# Patient Record
Sex: Female | Born: 1965 | Race: Black or African American | Hispanic: No | Marital: Single | Smoking: Current every day smoker
Health system: Southern US, Community
[De-identification: ages and names within clinical notes are randomized; demographics above are authoritative.]

## PROBLEM LIST (undated history)

## (undated) DIAGNOSIS — F419 Anxiety disorder, unspecified: Secondary | ICD-10-CM

## (undated) DIAGNOSIS — I10 Essential (primary) hypertension: Secondary | ICD-10-CM

## (undated) DIAGNOSIS — F32A Depression, unspecified: Secondary | ICD-10-CM

## (undated) HISTORY — PX: EYE SURGERY: SHX253

---

## 2004-05-17 ENCOUNTER — Emergency Department: Payer: Self-pay | Admitting: Emergency Medicine

## 2005-08-02 ENCOUNTER — Emergency Department: Payer: Self-pay | Admitting: Emergency Medicine

## 2007-01-16 ENCOUNTER — Emergency Department: Payer: Self-pay | Admitting: Emergency Medicine

## 2008-01-01 ENCOUNTER — Other Ambulatory Visit: Payer: Self-pay

## 2008-01-01 ENCOUNTER — Inpatient Hospital Stay: Payer: Self-pay | Admitting: Internal Medicine

## 2010-07-11 ENCOUNTER — Emergency Department: Payer: Self-pay | Admitting: Emergency Medicine

## 2013-01-01 ENCOUNTER — Inpatient Hospital Stay: Payer: Self-pay | Admitting: Family Medicine

## 2013-01-01 LAB — COMPREHENSIVE METABOLIC PANEL
Albumin: 3.3 g/dL — ABNORMAL LOW (ref 3.4–5.0)
Alkaline Phosphatase: 106 U/L (ref 50–136)
Anion Gap: 5 — ABNORMAL LOW (ref 7–16)
Chloride: 105 mmol/L (ref 98–107)
Creatinine: 0.61 mg/dL (ref 0.60–1.30)
EGFR (Non-African Amer.): >60
Osmolality: 276 (ref 275–301)
Potassium: 3 mmol/L — ABNORMAL LOW (ref 3.5–5.1)
SGOT(AST): 16 U/L (ref 15–37)
Sodium: 139 mmol/L (ref 136–145)
Total Protein: 7.3 g/dL (ref 6.4–8.2)

## 2013-01-01 LAB — LIPASE, BLOOD: Lipase: 3224 U/L — ABNORMAL HIGH (ref 73–393)

## 2013-01-01 LAB — CBC
HCT: 36.4 % (ref 35.0–47.0)
HGB: 12.3 g/dL (ref 12.0–16.0)
MCHC: 33.8 g/dL (ref 32.0–36.0)
MCV: 93 fL (ref 80–100)
RBC: 3.92 10*6/uL (ref 3.80–5.20)
RDW: 14.6 % — ABNORMAL HIGH (ref 11.5–14.5)

## 2013-01-01 LAB — URINALYSIS, COMPLETE
Blood: NEGATIVE
Glucose,UR: NEGATIVE mg/dL (ref 0–75)
RBC,UR: 1 /HPF (ref 0–5)
Specific Gravity: 1.016 (ref 1.003–1.030)
Squamous Epithelial: 5
WBC UR: 3 /HPF (ref 0–5)

## 2013-01-01 LAB — LIPID PANEL
Ldl Cholesterol, Calc: 89 mg/dL (ref 0–100)
Triglycerides: 110 mg/dL (ref 0–200)

## 2013-01-01 LAB — LACTATE DEHYDROGENASE: LDH: 191 U/L (ref 81–246)

## 2013-01-02 LAB — COMPREHENSIVE METABOLIC PANEL
Anion Gap: 9 (ref 7–16)
Bilirubin,Total: 0.3 mg/dL (ref 0.2–1.0)
Calcium, Total: 8.8 mg/dL (ref 8.5–10.1)
Co2: 27 mmol/L (ref 21–32)
Creatinine: 0.43 mg/dL — ABNORMAL LOW (ref 0.60–1.30)
EGFR (Non-African Amer.): >60
Potassium: 2.9 mmol/L — ABNORMAL LOW (ref 3.5–5.1)
SGPT (ALT): 13 U/L (ref 12–78)
Total Protein: 6.6 g/dL (ref 6.4–8.2)

## 2013-01-02 LAB — CANCER ANTIGEN 19-9: CA 19-9: 27 U/mL (ref 0–35)

## 2013-01-02 LAB — LIPASE, BLOOD: Lipase: 1730 U/L — ABNORMAL HIGH (ref 73–393)

## 2013-01-03 LAB — LIPASE, BLOOD: Lipase: 794 U/L — ABNORMAL HIGH (ref 73–393)

## 2013-01-03 LAB — CBC WITH DIFFERENTIAL/PLATELET
Basophil #: 0 10*3/uL (ref 0.0–0.1)
Basophil %: 0.6 %
Eosinophil %: 0.7 %
HGB: 10.6 g/dL — ABNORMAL LOW (ref 12.0–16.0)
Lymphocyte #: 1.2 10*3/uL (ref 1.0–3.6)
Neutrophil #: 4 10*3/uL (ref 1.4–6.5)
Neutrophil %: 70.8 %
RBC: 3.31 10*6/uL — ABNORMAL LOW (ref 3.80–5.20)
RDW: 14.2 % (ref 11.5–14.5)
WBC: 5.7 10*3/uL (ref 3.6–11.0)

## 2013-01-03 LAB — BASIC METABOLIC PANEL
Calcium, Total: 8.3 mg/dL — ABNORMAL LOW (ref 8.5–10.1)
Chloride: 104 mmol/L (ref 98–107)
Co2: 27 mmol/L (ref 21–32)
EGFR (Non-African Amer.): >60
Glucose: 75 mg/dL (ref 65–99)
Osmolality: 270 (ref 275–301)
Sodium: 137 mmol/L (ref 136–145)

## 2013-01-13 ENCOUNTER — Telehealth: Payer: Self-pay

## 2013-01-13 ENCOUNTER — Other Ambulatory Visit: Payer: Self-pay

## 2013-01-13 DIAGNOSIS — R932 Abnormal findings on diagnostic imaging of liver and biliary tract: Secondary | ICD-10-CM

## 2013-01-13 NOTE — Telephone Encounter (Signed)
Left message on machine to call back  

## 2013-01-13 NOTE — Telephone Encounter (Signed)
Pt must have CT on a disc at the appt.  Left message on machine to call back  Procedure Date: 01/22/13 Arrival Time: 1115 am Procedure Time: 1245 pm

## 2013-01-14 NOTE — Telephone Encounter (Signed)
Left message on machine to call back  

## 2013-01-15 NOTE — Telephone Encounter (Signed)
Pt states that she has already been scheduled for EUS on 01/26/13 at [facility], call placed to [facility] Left message on machine to call back

## 2013-01-15 NOTE — Telephone Encounter (Signed)
Pt was called and she informed me that she was already scheduled at [facility] on 01/26/13.  I called Dr Reyes Ivan office and Wyatt Mage states to cx the appt with Dr Christella Hartigan and the pt is to keep appt with [place] pt is aware and pt has been notified.  Procedure has been cx at [facility] with Noreene Larsson

## 2013-01-22 ENCOUNTER — Encounter (HOSPITAL_COMMUNITY): Admission: RE | Payer: Self-pay | Source: Ambulatory Visit

## 2013-01-22 ENCOUNTER — Ambulatory Visit (HOSPITAL_COMMUNITY): Admission: RE | Admit: 2013-01-22 | Payer: Self-pay | Source: Ambulatory Visit | Admitting: Gastroenterology

## 2013-01-22 SURGERY — UPPER ENDOSCOPIC ULTRASOUND (EUS) LINEAR
Anesthesia: Monitor Anesthesia Care

## 2013-03-04 ENCOUNTER — Inpatient Hospital Stay: Payer: Self-pay | Admitting: Internal Medicine

## 2013-03-04 LAB — COMPREHENSIVE METABOLIC PANEL
Albumin: 4.6 g/dL (ref 3.4–5.0)
Alkaline Phosphatase: 135 U/L (ref 50–136)
Anion Gap: 9 (ref 7–16)
BUN: 14 mg/dL (ref 7–18)
Bilirubin,Total: 0.9 mg/dL (ref 0.2–1.0)
Calcium, Total: 9.9 mg/dL (ref 8.5–10.1)
Chloride: 100 mmol/L (ref 98–107)
Co2: 25 mmol/L (ref 21–32)
Creatinine: 0.76 mg/dL (ref 0.60–1.30)
EGFR (African American): >60
EGFR (Non-African Amer.): >60
Glucose: 119 mg/dL — ABNORMAL HIGH (ref 65–99)
Osmolality: 270 (ref 275–301)
Potassium: 3 mmol/L — ABNORMAL LOW (ref 3.5–5.1)
SGOT(AST): 16 U/L (ref 15–37)
SGPT (ALT): 17 U/L (ref 12–78)
Sodium: 134 mmol/L — ABNORMAL LOW (ref 136–145)
Total Protein: 8.8 g/dL — ABNORMAL HIGH (ref 6.4–8.2)

## 2013-03-04 LAB — CBC
HCT: 43.4 % (ref 35.0–47.0)
Platelet: 433 10*3/uL (ref 150–440)
RBC: 4.77 10*6/uL (ref 3.80–5.20)
WBC: 11.7 10*3/uL — ABNORMAL HIGH (ref 3.6–11.0)

## 2013-03-04 LAB — URINALYSIS, COMPLETE
Bacteria: NONE SEEN
Leukocyte Esterase: NEGATIVE
Nitrite: NEGATIVE
Specific Gravity: 1.041 (ref 1.003–1.030)
Squamous Epithelial: 4

## 2013-03-05 LAB — COMPREHENSIVE METABOLIC PANEL
Albumin: 3.1 g/dL — ABNORMAL LOW (ref 3.4–5.0)
Anion Gap: 9 (ref 7–16)
BUN: 10 mg/dL (ref 7–18)
Calcium, Total: 8.6 mg/dL (ref 8.5–10.1)
Chloride: 106 mmol/L (ref 98–107)
Creatinine: 0.51 mg/dL — ABNORMAL LOW (ref 0.60–1.30)
EGFR (African American): >60
Glucose: 66 mg/dL (ref 65–99)
SGOT(AST): 15 U/L (ref 15–37)
SGPT (ALT): 13 U/L (ref 12–78)

## 2013-03-05 LAB — CBC WITH DIFFERENTIAL/PLATELET
Basophil %: 0.6 %
HCT: 34.1 % — ABNORMAL LOW (ref 35.0–47.0)
Lymphocyte #: 1.1 10*3/uL (ref 1.0–3.6)
MCV: 92 fL (ref 80–100)
Monocyte %: 7.5 %
RBC: 3.71 10*6/uL — ABNORMAL LOW (ref 3.80–5.20)
WBC: 7.5 10*3/uL (ref 3.6–11.0)

## 2013-03-05 LAB — MAGNESIUM: Magnesium: 1.6 mg/dL — ABNORMAL LOW

## 2013-03-06 LAB — COMPREHENSIVE METABOLIC PANEL
Anion Gap: 12 (ref 7–16)
Co2: 20 mmol/L — ABNORMAL LOW (ref 21–32)
EGFR (Non-African Amer.): >60
Osmolality: 265 (ref 275–301)
SGOT(AST): 22 U/L (ref 15–37)
SGPT (ALT): 13 U/L (ref 12–78)
Total Protein: 6.5 g/dL (ref 6.4–8.2)

## 2013-03-06 LAB — POTASSIUM: Potassium: 3.4 mmol/L — ABNORMAL LOW (ref 3.5–5.1)

## 2013-03-06 LAB — LIPASE, BLOOD: Lipase: 636 U/L — ABNORMAL HIGH (ref 73–393)

## 2013-04-29 ENCOUNTER — Emergency Department: Payer: Self-pay | Admitting: Emergency Medicine

## 2013-04-29 LAB — COMPREHENSIVE METABOLIC PANEL
Alkaline Phosphatase: 137 U/L — ABNORMAL HIGH (ref 50–136)
Anion Gap: 9 (ref 7–16)
Bilirubin,Total: 0.4 mg/dL (ref 0.2–1.0)
Calcium, Total: 9.4 mg/dL (ref 8.5–10.1)
Chloride: 105 mmol/L (ref 98–107)
Co2: 20 mmol/L — ABNORMAL LOW (ref 21–32)
Creatinine: 0.71 mg/dL (ref 0.60–1.30)
EGFR (African American): >60
SGOT(AST): 35 U/L (ref 15–37)
Total Protein: 8.1 g/dL (ref 6.4–8.2)

## 2013-04-29 LAB — URINALYSIS, COMPLETE
Bilirubin,UR: NEGATIVE
Blood: NEGATIVE
Glucose,UR: 50 mg/dL (ref 0–75)
Ketone: NEGATIVE
Nitrite: NEGATIVE
Ph: 6 (ref 4.5–8.0)
RBC,UR: 1 /HPF (ref 0–5)
Specific Gravity: 1.013 (ref 1.003–1.030)
Squamous Epithelial: 3
WBC UR: 1 /HPF (ref 0–5)

## 2013-04-29 LAB — CBC
MCV: 90 fL (ref 80–100)
Platelet: 428 10*3/uL (ref 150–440)
RBC: 4.84 10*6/uL (ref 3.80–5.20)

## 2013-04-29 LAB — LIPASE, BLOOD: Lipase: 2530 U/L — ABNORMAL HIGH (ref 73–393)

## 2013-07-09 HISTORY — PX: PANCREAS SURGERY: SHX731

## 2014-02-22 ENCOUNTER — Emergency Department: Payer: Self-pay | Admitting: Student

## 2014-02-22 LAB — URINALYSIS, COMPLETE
BILIRUBIN, UR: NEGATIVE
Bacteria: NONE SEEN
Blood: NEGATIVE
Glucose,UR: NEGATIVE mg/dL (ref 0–75)
Ketone: NEGATIVE
Leukocyte Esterase: NEGATIVE
NITRITE: NEGATIVE
PH: 5 (ref 4.5–8.0)
Protein: 30
RBC,UR: 3 /HPF (ref 0–5)
Specific Gravity: 1.029 (ref 1.003–1.030)

## 2014-02-22 LAB — COMPREHENSIVE METABOLIC PANEL
ANION GAP: 8 (ref 7–16)
Albumin: 2.7 g/dL — ABNORMAL LOW (ref 3.4–5.0)
Alkaline Phosphatase: 1561 U/L — ABNORMAL HIGH
BUN: 10 mg/dL (ref 7–18)
Bilirubin,Total: 0.4 mg/dL (ref 0.2–1.0)
CO2: 24 mmol/L (ref 21–32)
CREATININE: 0.78 mg/dL (ref 0.60–1.30)
Calcium, Total: 8.3 mg/dL — ABNORMAL LOW (ref 8.5–10.1)
Chloride: 110 mmol/L — ABNORMAL HIGH (ref 98–107)
EGFR (African American): >60
Glucose: 77 mg/dL (ref 65–99)
OSMOLALITY: 281 (ref 275–301)
POTASSIUM: 2.9 mmol/L — AB (ref 3.5–5.1)
SGOT(AST): 37 U/L (ref 15–37)
SGPT (ALT): 28 U/L
Sodium: 142 mmol/L (ref 136–145)
Total Protein: 7.3 g/dL (ref 6.4–8.2)

## 2014-02-22 LAB — CBC WITH DIFFERENTIAL/PLATELET
BASOS PCT: 1.2 %
Basophil #: 0.1 10*3/uL (ref 0.0–0.1)
Eosinophil #: 0.5 10*3/uL (ref 0.0–0.7)
Eosinophil %: 5.5 %
HCT: 39.3 % (ref 35.0–47.0)
HGB: 13 g/dL (ref 12.0–16.0)
LYMPHS ABS: 1.8 10*3/uL (ref 1.0–3.6)
Lymphocyte %: 21.3 %
MCH: 32.2 pg (ref 26.0–34.0)
MCHC: 33.1 g/dL (ref 32.0–36.0)
MCV: 97 fL (ref 80–100)
Monocyte #: 0.5 x10 3/mm (ref 0.2–0.9)
Monocyte %: 6.3 %
NEUTROS ABS: 5.4 10*3/uL (ref 1.4–6.5)
NEUTROS PCT: 65.7 %
PLATELETS: 490 10*3/uL — AB (ref 150–440)
RBC: 4.04 10*6/uL (ref 3.80–5.20)
RDW: 13.7 % (ref 11.5–14.5)
WBC: 8.2 10*3/uL (ref 3.6–11.0)

## 2014-02-22 LAB — LIPASE, BLOOD: Lipase: 575 U/L — ABNORMAL HIGH (ref 73–393)

## 2014-02-22 LAB — TROPONIN I: Troponin-I: <0.02 ng/mL

## 2014-07-28 ENCOUNTER — Inpatient Hospital Stay: Payer: Self-pay | Admitting: Surgery

## 2014-07-28 LAB — URINALYSIS, COMPLETE
BLOOD: NEGATIVE
Bacteria: NONE SEEN
Bilirubin,UR: NEGATIVE
Glucose,UR: NEGATIVE mg/dL (ref 0–75)
Ketone: NEGATIVE
LEUKOCYTE ESTERASE: NEGATIVE
NITRITE: NEGATIVE
PH: 6 (ref 4.5–8.0)
Protein: 30
RBC,UR: 1 /HPF (ref 0–5)
Specific Gravity: 1.015 (ref 1.003–1.030)
WBC UR: <1 /HPF (ref 0–5)

## 2014-07-28 LAB — CBC WITH DIFFERENTIAL/PLATELET
Basophil #: 0.1 10*3/uL (ref 0.0–0.1)
Basophil %: 0.6 %
EOS ABS: 0.1 10*3/uL (ref 0.0–0.7)
Eosinophil %: 0.7 %
HCT: 37.3 % (ref 35.0–47.0)
HGB: 12.4 g/dL (ref 12.0–16.0)
Lymphocyte #: 1.6 10*3/uL (ref 1.0–3.6)
Lymphocyte %: 19.4 %
MCH: 29.9 pg (ref 26.0–34.0)
MCHC: 33.3 g/dL (ref 32.0–36.0)
MCV: 90 fL (ref 80–100)
MONOS PCT: 4.7 %
Monocyte #: 0.4 x10 3/mm (ref 0.2–0.9)
NEUTROS ABS: 6.3 10*3/uL (ref 1.4–6.5)
NEUTROS PCT: 74.6 %
Platelet: 485 10*3/uL — ABNORMAL HIGH (ref 150–440)
RBC: 4.15 10*6/uL (ref 3.80–5.20)
RDW: 14.1 % (ref 11.5–14.5)
WBC: 8.4 10*3/uL (ref 3.6–11.0)

## 2014-07-28 LAB — COMPREHENSIVE METABOLIC PANEL
ALK PHOS: 1208 U/L — AB
ANION GAP: 10 (ref 7–16)
Albumin: 2.2 g/dL — ABNORMAL LOW (ref 3.4–5.0)
BILIRUBIN TOTAL: 0.8 mg/dL (ref 0.2–1.0)
BUN: 5 mg/dL — ABNORMAL LOW (ref 7–18)
CREATININE: 0.68 mg/dL (ref 0.60–1.30)
Calcium, Total: 7.5 mg/dL — ABNORMAL LOW (ref 8.5–10.1)
Chloride: 105 mmol/L (ref 98–107)
Co2: 25 mmol/L (ref 21–32)
EGFR (African American): >60
EGFR (Non-African Amer.): >60
GLUCOSE: 92 mg/dL (ref 65–99)
OSMOLALITY: 276 (ref 275–301)
Potassium: 2.9 mmol/L — ABNORMAL LOW (ref 3.5–5.1)
SGOT(AST): 39 U/L — ABNORMAL HIGH (ref 15–37)
SGPT (ALT): 39 U/L
SODIUM: 140 mmol/L (ref 136–145)
TOTAL PROTEIN: 6.2 g/dL — AB (ref 6.4–8.2)

## 2014-07-28 LAB — LIPID PANEL
CHOLESTEROL: 96 mg/dL (ref 0–200)
HDL Cholesterol: 33 mg/dL — ABNORMAL LOW (ref 40–60)
Ldl Cholesterol, Calc: 50 mg/dL (ref 0–100)
Triglycerides: 66 mg/dL (ref 0–200)
VLDL Cholesterol, Calc: 13 mg/dL (ref 5–40)

## 2014-07-28 LAB — TROPONIN I: Troponin-I: <0.02 ng/mL

## 2014-07-28 LAB — MAGNESIUM: Magnesium: 1.8 mg/dL

## 2014-07-28 LAB — LIPASE, BLOOD: Lipase: 448 U/L — ABNORMAL HIGH (ref 73–393)

## 2014-07-29 LAB — COMPREHENSIVE METABOLIC PANEL
ALBUMIN: 2 g/dL — AB (ref 3.4–5.0)
ALK PHOS: 891 U/L — AB
ANION GAP: 6 — AB (ref 7–16)
Bilirubin,Total: 0.7 mg/dL (ref 0.2–1.0)
CALCIUM: 7.8 mg/dL — AB (ref 8.5–10.1)
Chloride: 106 mmol/L (ref 98–107)
Co2: 22 mmol/L (ref 21–32)
Creatinine: 0.56 mg/dL — ABNORMAL LOW (ref 0.60–1.30)
EGFR (African American): >60
EGFR (Non-African Amer.): >60
GLUCOSE: 111 mg/dL — AB (ref 65–99)
POTASSIUM: 4.8 mmol/L (ref 3.5–5.1)
SGOT(AST): 23 U/L (ref 15–37)
SGPT (ALT): 27 U/L
SODIUM: 134 mmol/L — AB (ref 136–145)
TOTAL PROTEIN: 6.2 g/dL — AB (ref 6.4–8.2)

## 2014-07-29 LAB — LIPASE, BLOOD: Lipase: 266 U/L (ref 73–393)

## 2014-07-29 LAB — PHOSPHORUS: PHOSPHORUS: 1.9 mg/dL — AB (ref 2.5–4.9)

## 2014-07-30 LAB — BASIC METABOLIC PANEL
Anion Gap: 6 — ABNORMAL LOW (ref 7–16)
BUN: 2 mg/dL — ABNORMAL LOW (ref 7–18)
CREATININE: 0.5 mg/dL — AB (ref 0.60–1.30)
Calcium, Total: 8 mg/dL — ABNORMAL LOW (ref 8.5–10.1)
Chloride: 108 mmol/L — ABNORMAL HIGH (ref 98–107)
Co2: 25 mmol/L (ref 21–32)
EGFR (Non-African Amer.): >60
Glucose: 55 mg/dL — ABNORMAL LOW (ref 65–99)
Osmolality: 271 (ref 275–301)
Potassium: 4.7 mmol/L (ref 3.5–5.1)
SODIUM: 139 mmol/L (ref 136–145)

## 2014-07-31 LAB — COMPREHENSIVE METABOLIC PANEL
ANION GAP: 7 (ref 7–16)
Albumin: 1.6 g/dL — ABNORMAL LOW (ref 3.4–5.0)
Alkaline Phosphatase: 616 U/L — ABNORMAL HIGH
BILIRUBIN TOTAL: 0.5 mg/dL (ref 0.2–1.0)
BUN: 3 mg/dL — AB (ref 7–18)
CREATININE: 0.63 mg/dL (ref 0.60–1.30)
Calcium, Total: 7.6 mg/dL — ABNORMAL LOW (ref 8.5–10.1)
Chloride: 110 mmol/L — ABNORMAL HIGH (ref 98–107)
Co2: 22 mmol/L (ref 21–32)
GLUCOSE: 147 mg/dL — AB (ref 65–99)
OSMOLALITY: 277 (ref 275–301)
POTASSIUM: 4.5 mmol/L (ref 3.5–5.1)
SGOT(AST): 64 U/L — ABNORMAL HIGH (ref 15–37)
SGPT (ALT): 32 U/L
SODIUM: 139 mmol/L (ref 136–145)
Total Protein: 5.4 g/dL — ABNORMAL LOW (ref 6.4–8.2)

## 2014-07-31 LAB — CBC WITH DIFFERENTIAL/PLATELET
BASOS PCT: 0.8 %
Basophil #: 0.1 10*3/uL (ref 0.0–0.1)
EOS PCT: 0.5 %
Eosinophil #: 0.1 10*3/uL (ref 0.0–0.7)
HCT: 40.7 % (ref 35.0–47.0)
HGB: 13 g/dL (ref 12.0–16.0)
LYMPHS ABS: 1.1 10*3/uL (ref 1.0–3.6)
Lymphocyte %: 7 %
MCH: 29.2 pg (ref 26.0–34.0)
MCHC: 31.9 g/dL — AB (ref 32.0–36.0)
MCV: 92 fL (ref 80–100)
MONO ABS: 0.8 x10 3/mm (ref 0.2–0.9)
Monocyte %: 4.8 %
NEUTROS ABS: 14.2 10*3/uL — AB (ref 1.4–6.5)
Neutrophil %: 86.9 %
Platelet: 428 10*3/uL (ref 150–440)
RBC: 4.45 10*6/uL (ref 3.80–5.20)
RDW: 15.1 % — ABNORMAL HIGH (ref 11.5–14.5)
WBC: 16.3 10*3/uL — AB (ref 3.6–11.0)

## 2014-07-31 LAB — MAGNESIUM: MAGNESIUM: 1.6 mg/dL — AB

## 2014-07-31 LAB — PHOSPHORUS: Phosphorus: 2.6 mg/dL (ref 2.5–4.9)

## 2014-07-31 LAB — AMYLASE: AMYLASE: 52 U/L (ref 25–115)

## 2014-08-02 LAB — COMPREHENSIVE METABOLIC PANEL
Albumin: 1.5 g/dL — ABNORMAL LOW (ref 3.4–5.0)
Alkaline Phosphatase: 438 U/L — ABNORMAL HIGH
Anion Gap: 8 (ref 7–16)
BUN: 5 mg/dL — ABNORMAL LOW (ref 7–18)
Bilirubin,Total: 0.5 mg/dL (ref 0.2–1.0)
Calcium, Total: 7.1 mg/dL — ABNORMAL LOW (ref 8.5–10.1)
Chloride: 103 mmol/L (ref 98–107)
Co2: 23 mmol/L (ref 21–32)
Creatinine: 0.46 mg/dL — ABNORMAL LOW (ref 0.60–1.30)
EGFR (African American): >60
Glucose: 104 mg/dL — ABNORMAL HIGH (ref 65–99)
OSMOLALITY: 266 (ref 275–301)
Potassium: 3.4 mmol/L — ABNORMAL LOW (ref 3.5–5.1)
SGOT(AST): 19 U/L (ref 15–37)
SGPT (ALT): 23 U/L
SODIUM: 134 mmol/L — AB (ref 136–145)
Total Protein: 5.2 g/dL — ABNORMAL LOW (ref 6.4–8.2)

## 2014-08-02 LAB — CBC WITH DIFFERENTIAL/PLATELET
BASOS ABS: 0 10*3/uL (ref 0.0–0.1)
Basophil %: 0.4 %
EOS ABS: 0.2 10*3/uL (ref 0.0–0.7)
Eosinophil %: 3.4 %
HCT: 34.8 % — AB (ref 35.0–47.0)
HGB: 11.5 g/dL — AB (ref 12.0–16.0)
LYMPHS ABS: 1 10*3/uL (ref 1.0–3.6)
LYMPHS PCT: 13.9 %
MCH: 29.6 pg (ref 26.0–34.0)
MCHC: 32.9 g/dL (ref 32.0–36.0)
MCV: 90 fL (ref 80–100)
MONO ABS: 0.4 x10 3/mm (ref 0.2–0.9)
Monocyte %: 5.8 %
NEUTROS ABS: 5.3 10*3/uL (ref 1.4–6.5)
NEUTROS PCT: 76.5 %
Platelet: 399 10*3/uL (ref 150–440)
RBC: 3.87 10*6/uL (ref 3.80–5.20)
RDW: 14.4 % (ref 11.5–14.5)
WBC: 6.9 10*3/uL (ref 3.6–11.0)

## 2014-08-02 LAB — PHOSPHORUS: PHOSPHORUS: 2.3 mg/dL — AB (ref 2.5–4.9)

## 2014-08-02 LAB — MAGNESIUM: MAGNESIUM: 1.8 mg/dL

## 2014-08-03 LAB — COMPREHENSIVE METABOLIC PANEL
ALBUMIN: 1.5 g/dL — AB (ref 3.4–5.0)
ALK PHOS: 384 U/L — AB (ref 46–116)
ALT: 15 U/L (ref 14–63)
Anion Gap: 10 (ref 7–16)
BUN: 12 mg/dL (ref 7–18)
Bilirubin,Total: 0.4 mg/dL (ref 0.2–1.0)
CALCIUM: 7.4 mg/dL — AB (ref 8.5–10.1)
CO2: 23 mmol/L (ref 21–32)
Chloride: 104 mmol/L (ref 98–107)
Creatinine: 0.54 mg/dL — ABNORMAL LOW (ref 0.60–1.30)
EGFR (African American): >60
GLUCOSE: 107 mg/dL — AB (ref 65–99)
Osmolality: 274 (ref 275–301)
Potassium: 4.1 mmol/L (ref 3.5–5.1)
SGOT(AST): 16 U/L (ref 15–37)
Sodium: 137 mmol/L (ref 136–145)
Total Protein: 5.3 g/dL — ABNORMAL LOW (ref 6.4–8.2)

## 2014-08-03 LAB — CBC WITH DIFFERENTIAL/PLATELET
BASOS ABS: 0 10*3/uL (ref 0.0–0.1)
Basophil %: 0.3 %
Eosinophil #: 0.1 10*3/uL (ref 0.0–0.7)
Eosinophil %: 1.6 %
HCT: 37 % (ref 35.0–47.0)
HGB: 12 g/dL (ref 12.0–16.0)
LYMPHS PCT: 10.1 %
Lymphocyte #: 0.8 10*3/uL — ABNORMAL LOW (ref 1.0–3.6)
MCH: 28.8 pg (ref 26.0–34.0)
MCHC: 32.5 g/dL (ref 32.0–36.0)
MCV: 89 fL (ref 80–100)
MONO ABS: 0.8 x10 3/mm (ref 0.2–0.9)
Monocyte %: 10 %
NEUTROS PCT: 78 %
Neutrophil #: 6 10*3/uL (ref 1.4–6.5)
PLATELETS: 481 10*3/uL — AB (ref 150–440)
RBC: 4.18 10*6/uL (ref 3.80–5.20)
RDW: 14.7 % — AB (ref 11.5–14.5)
WBC: 7.7 10*3/uL (ref 3.6–11.0)

## 2014-08-04 LAB — TPN PANEL
ACTIVATED PTT: 38.9 s — AB (ref 23.6–35.9)
Albumin: 1.3 g/dL — ABNORMAL LOW (ref 3.4–5.0)
Alkaline Phosphatase: 306 U/L — ABNORMAL HIGH (ref 46–116)
Anion Gap: 7 (ref 7–16)
BUN: 13 mg/dL (ref 7–18)
CREATININE: 0.48 mg/dL — AB (ref 0.60–1.30)
Calcium, Total: 7.8 mg/dL — ABNORMAL LOW (ref 8.5–10.1)
Chloride: 105 mmol/L (ref 98–107)
Cholesterol: 70 mg/dL (ref 0–200)
Co2: 24 mmol/L (ref 21–32)
Glucose: 89 mg/dL (ref 65–99)
HGB: 10.8 g/dL — ABNORMAL LOW (ref 12.0–16.0)
INR: 1.2
Magnesium: 1.8 mg/dL
Osmolality: 272 (ref 275–301)
PLATELETS: 464 10*3/uL — AB (ref 150–440)
Phosphorus: 2.3 mg/dL — ABNORMAL LOW (ref 2.5–4.9)
Potassium: 3.9 mmol/L (ref 3.5–5.1)
Prothrombin Time: 14.9 secs — ABNORMAL HIGH (ref 11.5–14.7)
SGOT(AST): 15 U/L (ref 15–37)
Sodium: 136 mmol/L (ref 136–145)
TOTAL PROTEIN: 4.8 g/dL — AB (ref 6.4–8.2)
Triglycerides: 88 mg/dL (ref 0–200)
WBC: 7 10*3/uL (ref 3.6–11.0)

## 2014-08-04 LAB — AMYLASE, BODY FLUID: Amylase, Body Fluid: 9 U/L

## 2014-10-29 NOTE — H&P (Signed)
PATIENT NAME:  Megan Mayer, Megan Mayer MR#:  [id number] DATE OF BIRTH:  [birth year]  DATE OF ADMISSION:  03/04/2013  REFERRING PHYSICIAN: Sheryl L. Mindi Junker, MD  PRIMARY CARE PHYSICIAN: None.  CHIEF COMPLAINT: Abdominal pain.   HISTORY OF PRESENT ILLNESS: The patient is a 49 year old African American female with a history of pancreatitis a few times, hypertension, tobacco abuse, history of C. diff. She of note was hospitalized here in late June for pancreatitis, where she was found with abdominal mass in the pancreas. She was discharged after her pancreatitis improved. She was to go to [facility] for endoscopic ultrasound for further evaluation of the pancreatic mass. She stated that she went to [facility], but they just did abdominal ultrasound and stated that they will be forwarding it to her primary care physician, but she states she does not have a primary care physician. She has had no followup since previous discharge with any physician. She states that she has no insurance and when she calls the offices, she is unable to obtain appointments. She comes in after having progressive and continued abdominal pain. She stated that after her pain medications ran out early this month, her abdominal pain resumed. She states she has difficulty eating and she has abdominal bloating. She came in here today as the pain persisted and she was found with a lipase of 3600. Hospitalist services were contacted for further evaluation and management.   PAST MEDICAL HISTORY: History of hypertension, noncompliant with medications, acute pancreatitis, history of C. diff.  FAMILY HISTORY: Brain cancer in the mother.   SOCIAL HISTORY: Denies tobacco, alcohol or drug use. Is unemployed.   ALLERGIES: No known drug allergies.   HOME MEDICATIONS: She states she has not taken any medications for her blood pressure since her medications ran out. Of note, she was discharged on amlodipine 5 mg daily, clonidine 0.1 mg 2 times a day, aspirin 81 mg  daily, Aleve p.r.n., hydrochlorothiazide/lisinopril 25/20 mg 1 tab daily and some acetaminophen/hydrocodone prior, but she does not take any medications.   REVIEW OF SYSTEMS:    CONSTITUTIONAL: Positive for weight loss of about 30 pounds since June, not much since that last discharge she states. No fevers, fatigue or weakness.  EYES: No blurry vision or double vision.  ENT: No tinnitus or hearing loss. No snoring.  RESPIRATORY: Denies cough, wheezing, shortness of breath, painful respirations.  CARDIOVASCULAR: Denies chest pains, orthopnea or swelling in the legs. No arrhythmias.  GASTROINTESTINAL: No nausea, vomiting or diarrhea. Has abdominal pain in upper abdomen shooting to the back. No melena or dark stools. History of pancreatitis.  GENITOURINARY: Denies dysuria or hematuria.  HEMATOLOGIC AND LYMPHATIC: Denies anemia or easy bruising.  SKIN: No rashes.  MUSCULOSKELETAL: Denies arthritis or gout.  NEUROLOGIC: Denies focal weakness, numbness, stroke or seizures.  PSYCHIATRIC: Denies anxiety, insomnia.   PHYSICAL EXAMINATION:  VITAL SIGNS: Temperature on arrival 98.1, initial pulse 90, respiratory rate 18. Blood pressure was 193/120. Last blood pressure is 185/109. O2 sat was 99% on room air.  GENERAL: The patient is a cachectic-appearing, thin African American female lying in bed, no obvious distress.  HEENT: Normocephalic, atraumatic. Pupils are equal and reactive. Anicteric sclerae. Extraocular muscles intact. Dry mucous membranes.  NECK: Supple. No thyroid tenderness. No cervical lymphadenopathy. No JVD.  CARDIOVASCULAR: S1, S2, regular rate and rhythm. No murmurs, rubs or gallops.  LUNGS: Clear to auscultation. No wheezing, rhonchi or rales.  ABDOMEN: Soft, thin. Positive bowel sounds all quadrants. She has tenderness, epigastric area upper, to  palpation. No rebound or guarding.  EXTREMITIES: No significant lower extremity edema.  SKIN: No obvious rashes.  NEUROLOGIC: Cranial nerves  II through XII grossly intact. Strength is 5 out of 5 in all extremities. Sensation is intact to light touch.  PSYCHIATRIC: Awake, alert, oriented x 3, cooperative.   LABORATORY AND RADIOLOGICAL DATA: Glucose 119, BUN 14, creatinine 0.76, sodium 134, potassium 3. Lipase is 3652. Total protein is 8.8, otherwise within normal limits. WBC is 11.7, hemoglobin 15; platelets are 433. Urinalysis has 3+ blood, 2616 RBC, 13 WBC, no bacteria. No imaging done, but CAT scan has been ordered, and I do not see an EKG in the chart.   ASSESSMENT AND PLAN: We have a 49 year old African American female with history of hypertension, who is not on any medications as her pills ran out, history of pancreatitis in the past, was supposed to have endoscopic ultrasound for further evaluation, as there is a pancreatic mass with history of biliary pancreatitis in the past, who comes in for ongoing pain for a few weeks after her pain pills "ran out." Of note, she initially had a CAT scan done previously showing a 6.6 cm mass with dilated CBD. She has had no luck seeing a physician, as she states she has no insurance. At this point, we will bring her in for the acute pancreatitis. A CAT scan has been ordered to further investigate this mass. Will obtain a gastroenterology consult, start her on IV fluid resuscitation, morphine and Tylenol p.r.n., and put in a case management consult to help to plug her into [facility] perhaps. We would  trend the lipase. We will see what the CAT scan shows. As the patient had no further work-up such as endoscopic ultrasound, we will see if we can perhaps arrange that as well by discharge, at [facility] if it is possible or [facility]. This could be a pseudocyst or a malignancy, but needs further work-up. The patient also has accelerated hypertension secondary to not taking any medications and also possibly pain attributing to some of this. I would resume some of her outpatient medications including amlodipine and  clonidine and start her on hydralazine IV p.r.n. We will try to control the pain. She has hypokalemia and we would replace the potassium. We will start her on deep vein prophylaxis with heparin and obtain a GI consult.   CODE STATUS: The patient is full code.   TOTAL TIME SPENT: 50 minutes.    ____________________________ Krystal EatonShayiq Jahnae Mcadoo, MD sa:jm D: 03/04/2013 12:55:44 ET T: 03/04/2013 13:26:40 ET JOB#: [id number]  cc: Krystal EatonShayiq Orlie Cundari, MD, <Dictator> Krystal EatonSHAYIQ Dai Mcadams MD ELECTRONICALLY SIGNED 03/31/2013 13:46

## 2014-10-29 NOTE — Consult Note (Signed)
PATIENT NAME:  Megan Mayer, Megan Mayer MR#:  [id number] DATE OF BIRTH:  10-10-1965  DATE OF CONSULTATION:  01/02/2013  REFERRING PHYSICIAN:  Dr. Darvin Neighbours. CONSULTING PHYSICIAN:  Lollie Sails, MD  REASON FOR CONSULTATION:  Pancreatitis, possible pancreatic mass.   HISTORY OF PRESENT ILLNESS: Megan Mayer is a very pleasant 49 year old African American female who presented to the Emergency Room last night with problems of abdominal pain. She states that about 2 weeks ago she began to have some increasing problems with abdominal pain. This seemed to occur after eating. She then fell this past Tuesday against a table and injured her right upper quadrant. She states she felt much worse after that. She has had no nausea or vomiting. The apparent injury from the fall seems to be getting better. She states that prior to this episode, she has been having no problems with nausea, vomiting or abdominal pain. No heartburn or dysphagia. She has a bowel movement once a day. There are no black stools, blood in stools or slimy stools. There is no history of peptic ulcer disease. She has never had a colonoscopy. The patient states that she did have an episode of pancreatitis about 3 to 4 years ago. In review, this was apparently related to biliary pancreatitis with finding of an 8.8 mm common bile duct, as well as sludge in the gallbladder. This was on 07/11/2010. She did not undergo cholecystectomy at that time.   PAST MEDICAL HISTORY: The patient has a history of hypertension. History of ongoing tobacco use. History of C. diff colitis. Acute pancreatitis, biliary in nature, as noted above.   SOCIAL HISTORY: The patient is a smoker, about a third of a pack of cigarettes daily. She denies any alcohol use for at least 4 years. Prior to that, she was never a regular alcohol user or of large volume. There is no history of illicit drugs.   ALLERGIES: There are no known drug allergies.   MEDICATIONS:  The patient takes no  home medications.   REVIEW OF SYSTEMS:  Ten systems reviewed per admission history and physical, agree with same.   PHYSICAL EXAMINATION: VITAL SIGNS: Temperature is 98, pulse 75, respirations 16, blood pressure 148/100, pulse ox 99%.  GENERAL: She is a 49 year old African American female in no acute distress.  HEENT: Normocephalic, atraumatic. Eyes are anicteric.  Nose: Septum midline. No lesions. Oropharynx: No lesions.  NECK: Supple. No JVD.  HEART: Regular rate and rhythm without rub or gallop.  LUNGS: Bilaterally clear.  ABDOMEN: Soft. She is markedly tender to palpation in the epigastric region extending into the left and right upper quadrants. The right upper quadrant may be somewhat more so than the left. There are no masses palpated, although it is difficult to palpate due to pain. Bowel sounds are positive.  RECTAL: Anorectal exam is deferred.  EXTREMITIES: No clubbing, cyanosis or edema.  NEUROLOGICAL: Cranial nerves II through XII grossly intact. Muscle strength bilaterally equal and symmetric.   RADIOLOGIC AND LABORATORY DATA:  On admission to the hospital on the 26th, she had a glucose of 100, BUN 8, creatinine 0.61, sodium 139, potassium 3.0, chloride 105, calcium 9.1. Magnesium 1.5. Cholesterol 157, triglycerides 110. Her lipase was 3224. Her hepatic profile showing a total protein of 7.3, albumin 3.3, total bilirubin 0.2, alk phos 106, AST 16, ALT 15. Hemogram is showing a white count of 8.8, H and H 12.3/36.4, platelet count of 491, MCV is 93. Urinalysis was negative. She had a CA-19-9 done that was  negative at 27. Repeat laboratories this morning showed glucose at 101, BUN 5, creatinine 0.43, sodium 135, potassium 2.9. Her lipase had decreased to 1730. Her hepatic profile showing a total protein of 6.6, albumin 3.0, total bilirubin 0.3, alk phos 97, AST 13, ALT 13. She has had 2 imaging studies. She had an abdominal ultrasound, which showed an abnormal mass arising from the head of  the pancreas or the adjacent left hepatic lobe. This measured 6.6 cm in diameter. There was sludge in the gallbladder, but no discrete stones, and the common bile duct was dilated at 9.7 mm. The origin of the mass lesion was, indeed, very difficult to ascertain for this study. There was an abdominal CT scan was also, which showed findings concerning for a pancreatic head and uncinate process mass. Findings was also consistent with chronic pancreatitis and acute on chronic FLAIR. There was diffuse dilatation of pancreatic duct and this measuring about 9.3 mm. Of note, there was also evidence of atrophy of the body and tail of the pancreas.   ASSESSMENT:   1.  Pancreatitis. CT scanning, as well as abdominal ultrasound were concerning for possible pancreatic mass. In the setting of a low CA-19-9, this is quite likely an inflammatory mass rather than pancreatic cancer. The patient does have a history of biliary pancreatitis in the past, and indeed, may be presenting with an extension of that, along with evidence of chronic pancreatitis. There is atrophy of the body and tail of the pancreas. Currently, the patient is feeling somewhat better. Her lipase has improved.   RECOMMENDATIONS: 1.  Agree with further evaluation via endoscopic ultrasound. We do not have that capability currently at [facility]; however, the patient could be sent to either [facility], or indeed, [facility] in [place] for that procedure. In the interim, would continue on current treatment of bowel rest, IV hydration, along with pain management. Would consider repeating the abdominal ultrasound in about 3 days for comparison purposes, as well as surgical opinion in regards to possibility of cholecystectomy.    ____________________________ Lollie Sails, MD mus:dmm D: 01/02/2013 17:28:55 ET T: 01/02/2013 19:39:10 ET JOB#: [id number]  cc: Lollie Sails, MD, <Dictator> Lollie Sails MD ELECTRONICALLY SIGNED  02/02/2013 7:13

## 2014-10-29 NOTE — Discharge Summary (Signed)
PATIENT NAME:  Megan Mayer, Megan Mayer MR#:  [id number] DATE OF BIRTH:  [birth year]  DATE OF ADMISSION:  03/04/2013 DATE OF DISCHARGE:  03/06/2013  DISCHARGE DIAGNOSES: 1.  Acute on chronic pancreatitis.  2.  Acute uncontrolled hypertension. 3.  Severe chronic malnutrition.  4.  Noncompliance to medication.   CONDITION ON DISCHARGE: Stable.   CODE STATUS: Full code.   DISCHARGE MEDICATIONS: 1.  Oxycodone 15 mg oral tablet extended-release every 12 hours for 10 days.  2.  Percocet 325/5 mg oral tablet every 8 hours as needed for pain.  3.  Lisinopril 20 mg once a day.  4.  Clonidine 0.1 mg oral tablet 2 times a day.  5.  Creon 12,000 units 1 capsule 3 times a day.  6.  Amlodipine 5 mg oral tablet once a day.  7.  Hydrochlorothiazide 25 mg oral tablet once a day.   DIET ON DISCHARGE: Low-sodium, low-fat, low-cholesterol, Ensure Plus dietary supplement 2 times per day.   ACTIVITY LIMITATION: As tolerated.   FOLLOW-UP: Within 1 to 2 weeks at [facility].   HISTORY OF PRESENT ILLNESS: The patient is a 49 year old female with history of pancreatitis a few times in the past. She was having abdominal pain and so came to the ER. As per history, the patient was admitted to our hospital a month ago and found having some pancreatic mass. She was sent to [facility] for endoscopic ultrasound for further evaluation of pancreatic mass. At [facility], they did abdominal ultrasound versus an endoscopic and discharged her home with advice to follow later on. She did not have any insurance and so was unable to follow with any doctor, was unable to get any medication also and started having abdominal pain, so decided to come to the Emergency Room. Her lipase was found 3600 in the ER, so admitted with acute on chronic pancreatitis. GI consult was called in with Dr. Bluford Kaufmann and he suggested managing conservatively her pancreatitis issue at this time and insisted about her following at [facility] for endoscopic ultrasound or as [facility] is to going to be equiped for this and enough support for this procedure very soon, to get endoscopic ultrasound and so that was the plan suggested by Dr. Bluford Kaufmann.     OTHER MEDICAL ISSUES:  1.  Pancreatic mass versus cyst. Endoscopic ultrasound when possible at [facility] or [facility].  2.  Acute on chronic pancreatitis. Maintained on IV fluids and pain management. She tolerated gradually liquid diet and then regular diet later on after 3 days and was discharged home with advice to follow.  3.  Uncontrolled hypertension due to noncompliance, started on amlodipine, clonidine and lisinopril and blood pressure came down under control.  4.  Electrolyte imbalance, hypokalemia and hypomagnesemia, was replaced IV and the patient remained stable.   CONSULT IN THE HOSPITAL: Dr. Bluford Kaufmann for GI.   LABORATORY RESULTS:  Creatinine 0.76, sodium was 134, potassium was 3 on presentation. WBC was 11.7, hemoglobin was 15 and platelet count was 433. Lipase was 3652. Urinalysis was having 2600 RBCs and 13 WBC with negative leukocyte esterase.   CT of the abdomen and pelvis with contrast: Marked edema of pancreatic head, likely related to acute pancreatitis, superimposed changes, chronic pancreatitis.   Lipase level came down on 28th of August, 1157 and on 29th, further came down to 636.   TOTAL TIME SPENT ON THIS DISCHARGE: 40 minutes.   ____________________________ Megan Mayer [name], MD vgv:cc D: 03/10/2013 21:55:41 ET T: 03/10/2013 23:15:33 ET JOB#:  [id number]  cc: Megan PigeonVaibhavkumar G. Elisabeth PigeonVachhani, MD, <Dictator> Ezzard StandingPaul Y. Bluford Kaufmannh, MD  Altamese DillingVAIBHAVKUMAR Hansford Hirt MD ELECTRONICALLY SIGNED 03/13/2013 19:11

## 2014-10-29 NOTE — Consult Note (Signed)
Pt seen and examined. Full consult to follow. Less pain this AM. Lipase coming down. Inflammatory mass seen on CT. Needs EUS eventually. [facility] only did abd u/s only. We are supposed to have [facility] coming to [facility] starting sometime in October. Perhaps, this patient can have it then. Continue IV hydration. NPO for now. Possibly start clear liquids tomorrow if continues to improve. Recommend pancreatic enzyme supplements with each meal/snack since patient likely has chronic pancreatitis.  Electronic Signatures: Lutricia Feilh, Glennie Bose (MD)  (Signed on 28-Aug-14 12:36)  Authored  Last Updated: 28-Aug-14 12:36 by Lutricia Feilh, Antonya Leeder (MD)

## 2014-10-29 NOTE — H&P (Signed)
PATIENT NAME:  Megan Mayer, Megan Mayer MR#:  [id number] DATE OF BIRTH:  August 05, 1965  DATE OF ADMISSION:  01/01/2013  PRIMARY CARE PHYSICIAN: None.   CHIEF COMPLAINT: Abdominal pain of 2 weeks.   HISTORY OF PRESENTING ILLNESS: The patient is Mayer 49 year old African American female patient with past history of hypertension which is untreated, acute pancreatitis, tobacco abuse presents to the Emergency Room complaining of 2 weeks of epigastric abdominal pain. The patient also has some right lower chest pain since Mayer mechanical fall Mayer week back. The patient has had no nausea, vomiting, diarrhea, hematemesis, melena. She mentions she lost about 30 pounds in the last 2 weeks, poor appetite, early satiety. The patient had an ultrasound of the abdomen done today in the Emergency Room which shows Mayer pancreatic head mass about 6 x 6 cm with dilated CBD of 9.7 cm.   The patient had similar pain 1 year back, was here in the Emergency Room with lipase of 1800, was discharged home with diagnosis of acute pancreatitis, unknown etiology. The patient is not on any medications, not alcoholic. No history of hyperlipidemia.   PAST MEDICAL HISTORY: 1.  Untreated hypertension.  2.  Tobacco abuse.  3.  Acute pancreatitis.  4.  C. diff colitis.   FAMILY HISTORY: Brain cancer in mother.  Father was killed.   SOCIAL HISTORY: The patient smokes Mayer pack Mayer day. Does not drink any alcohol. No illicit drugs. Is unemployed.   ALLERGIES: No known drug allergies.   HOME MEDICATIONS: None.   REVIEW OF SYSTEMS:  CONSTITUTIONAL: Complains of some fatigue. Had significant weight loss.  EYES: No blurred vision, pain, redness.  ENT: No tinnitus, ear pain, hearing loss.  RESPIRATORY: No cough, wheeze, hemoptysis.  CARDIOVASCULAR: No chest pain, orthopnea, edema.  GASTROINTESTINAL: Had some nausea. No vomiting. Has abdominal pain. No melena, hematemesis.  GENITOURINARY: No dysuria, hematuria, frequency.  ENDOCRINE: No polyuria,  nocturia, thyroid problems.  HEMATOLOGIC/LYMPHATIC: No anemia, easy bruising, bleeding.  INTEGUMENTARY: No acne, rash, lesions.  MUSCULOSKELETAL: No joint swelling, back pain or arthritis.  NEUROLOGICAL: No focal numbness, weakness, dysarthria.  PSYCHIATRIC: No anxiety or depression.   PHYSICAL EXAMINATION: VITAL SIGNS: Temperature 97.8, pulse of 107, blood pressure 205/113, saturating 99% on room air.  GENERAL: Mayer thin African American female patient lying in bed, conversational, cooperative with exam.  PSYCHIATRIC:  Is alert and oriented x 3. Mood and affect are appropriate. Judgment intact.  HEENT: Atraumatic, normocephalic. Oral mucosa moist and pink. External ears and nose normal. No pallor.  His pupils are bilaterally equal and reactive to light.  NECK: Supple. No thyromegaly. No palpable lymph nodes. Trachea midline. No carotid bruit or JVD.  CARDIOVASCULAR: S1, S2, tachycardic without any murmurs. Peripheral pulses 2+. No edema.  RESPIRATORY: Normal work of breathing. Clear to auscultation on both sides, has tenderness in the right lower chest.  GASTROINTESTINAL: Soft abdomen. Tenderness in the epigastric and right upper quadrant area. Murphy's sign negative. Bowel sounds present. No hepatosplenomegaly palpable.  SKIN: Warm and dry. No petechiae, rash, ulcers.  MUSCULOSKELETAL: No joint swelling, redness, effusion of the large joints. Normal muscle tone.  NEUROLOGICAL: Motor strength 5 out of 5 in upper and lower extremities. Sensation is intact all over.  LYMPHATIC: No cervical or supraclavicular lymphadenopathy.   GENITOURINARY: No CVA tenderness or bladder distention.   LABORATORY AND RADIOLOGICAL DATA:  Lab studies show glucose of 100, BUN 8, creatinine 0.61, sodium 139, potassium 3, chloride 105, GFR greater than 60, magnesium 1.5, lipase of 3224,  LDH 191, AST, ALT, alkaline phosphatase bilirubin normal. WBC 8.8, hemoglobin 12.3, platelets of 491.  Ultrasound of the abdomen shows  an abnormal mass that may be arising from head of the pancreas or from the adjacent left hepatic lobe, measures 6.6 cm in diameter. Sludge within the gallbladder but no discrete stones. Murphy's negative.   Common bile duct dilated at 9.7 mm.   ASSESSMENT AND PLAN: 1.  Acute pancreatitis with Mayer pancreatic head mass on ultrasound. The patient also had significant weight loss of 30 pounds in the last 2 weeks. We will put the patient on liquids if she can tolerate. Pain management with IV pain medications. I will get Mayer CT scan of the abdomen with contrast. Her mass could be either malignancy or pseudocyst, doubt this is an abscess with no fever, no white count. We will consult GI regarding further input with the case. We will also check Mayer fasting lipid profile.  2.  Hypertensive urgency. The patient is not on treatment for her hypertension.  We will start her on p.o. meds, use IV p.r.n. medications as needed.  3.  Tobacco abuse. I have counseled the patient to quit smoking for greater than 3 minutes. I did tell her this could be Mayer pancreatic malignancy, awaiting further tests, and she should quit smoking at this time. The patient has verbalized understanding.  4.  Deep venous thrombosis prophylaxis with Lovenox.   TIME SPENT:   Today on this case was 45 minutes.    ____________________________ Megan Bailiff Miles Leyda, MD srs:cb D: 01/01/2013 16:48:51 ET T: 01/01/2013 17:07:26 ET JOB#: [id number]  cc: Wardell Heath R. Elpidio Anis, MD, <Dictator> Orie Fisherman MD ELECTRONICALLY SIGNED 01/02/2013 15:54

## 2014-10-29 NOTE — Consult Note (Signed)
Chief Complaint:  Subjective/Chief Complaint Covering for Dr. Gustavo Lah. Feels much better. Some abd pain. Currently on full liquid diet plus nutritional supplement.   VITAL SIGNS/ANCILLARY NOTES: **Vital Signs.:   28-Jun-14 05:32  Vital Signs Type Routine  Temperature Temperature (F) 98.4  Celsius 36.8  Temperature Source oral  Pulse Pulse 69  Respirations Respirations 20  Systolic BP Systolic BP 277  Diastolic BP (mmHg) Diastolic BP (mmHg) 78  Mean BP 100  Pulse Ox % Pulse Ox % 97  Pulse Ox Activity Level  At rest  Oxygen Delivery Room Air/ 21 %   Brief Assessment:  GEN no acute distress   Cardiac Regular   Respiratory clear BS   Gastrointestinal mild epig tenderness   Lab Results: Routine Chem:  28-Jun-14 06:21   BUN  5  Creatinine (comp) 0.64  Sodium, Serum 137  Potassium, Serum 3.5  Chloride, Serum 104  CO2, Serum 27  Calcium (Total), Serum  8.3  Anion Gap  6  Osmolality (calc) 270  eGFR (African American) >60  eGFR (Non-African American) >60 (eGFR values <72m/min/1.73 m2 may be an indication of chronic kidney disease (CKD). Calculated eGFR is useful in patients with stable renal function. The eGFR calculation will not be reliable in acutely ill patients when serum creatinine is changing rapidly. It is not useful in  patients on dialysis. The eGFR calculation may not be applicable to patients at the low and high extremes of body sizes, pregnant women, and vegetarians.)  Lipase  794 (Result(s) reported on 03 Jan 2013 at 06:50AM.)  Magnesium, Serum  1.5 (1.8-2.4 THERAPEUTIC RANGE: 4-7 mg/dL TOXIC: > 10 mg/dL  -----------------------)  Routine Hem:  28-Jun-14 06:21   WBC (CBC) 5.7  RBC (CBC)  3.31  Hemoglobin (CBC)  10.6  Hematocrit (CBC)  30.7  Platelet Count (CBC)  449  MCV 93  MCH 32.0  MCHC 34.4  RDW 14.2  Neutrophil % 70.8  Lymphocyte % 20.9  Monocyte % 7.0  Eosinophil % 0.7  Basophil % 0.6  Neutrophil # 4.0  Lymphocyte # 1.2  Monocyte  # 0.4  Eosinophil # 0.0  Basophil # 0.0 (Result(s) reported on 03 Jan 2013 at 06:49AM.)   Radiology Results: CT:    26-Jun-14 18:40, CT Abdomen and Pelvis With Contrast  CT Abdomen and Pelvis With Contrast   REASON FOR EXAM:    (1) abd pain, pancreatic mass; (2) abd pain  COMMENTS:       PROCEDURE: CT  - CT ABDOMEN / PELVIS  W  - Jan 01 2013  6:40PM     RESULT: CT and pelvis dated 01/01/2013    Technique: Helical 3 mm sections were obtained from the lung bases   through the pubic symphysis status post intravenous ministration of 85 mL   of Isovue-370 and oral contrast.    Findings: The lung bases are unremarkable. Minimal hypoventilation is   appreciated.    The liver, spleen, adrenals, kidneys are unremarkable.  Evaluation of the pancreas demonstrates a component of mild to moderate   atrophy involving the body and tail and there is diffuse dilation of the   pancreatic duct. Evaluation of the head and uncinate process demonstrates   course calcifications within the pancreatic parenchyma. There is   heterogeneous enhancement of the uncinate process and head with a   masslike configuration. These findings are concerning for a pancreatic   head and uncinate process mass until proven otherwise. Alternatively   changes secondary to chronic pancreatitis with an  acute FLAIR cannot be   excluded particularly if clinically appropriate. The heterogeneous   enhancement in the appropriate clinical setting raise concern of areas of   early or mild necrosis. There is a minimal amount of peripancreatic fluid   and a minimal amount of peripancreatic inflammatory change. The most   confluent masslike area measures 4.8 x 5.7 cm in AP by transverse   dimensions.  Air-filled distended loops of large bowel appreciated within the abdomen   as well as of fecal retention in the ascending colon. Contrast filled   loops small bowel identified in the abdomen pelvis urinary bladder is   distended with  urine.    There is no evidence of abdominal aortic aneurysm.    IMPRESSION:  Findings concerning for a pancreatic head and uncinate   process mass otherwise. There are findings consistent with chronic   pancreatitis and acute on chronic FLAIR is also of diagnostic   consideration. Tissue sampling with endoscopic ultrasound is recommended.  2. Diffuse dilation of the pancreatic duct and 9.3 mm. This finding to be   secondary to chronic pancreatitis though a component of dilatation may be   secondary to obstruction to the is matched.    Verified By: Mikki Santee, M.D., MD   Assessment/Plan:  Assessment/Plan:  Assessment Pancreatitis with pancreatic mass   Plan Moniter lipase as diet is advanced. If patient can tolerate low fat diet, then patient can be discharged to home with EUS as outpt. thanks.   Electronic Signatures: Verdie Shire (MD)  (Signed 28-Jun-14 09:29)  Authored: Chief Complaint, VITAL SIGNS/ANCILLARY NOTES, Brief Assessment, Lab Results, Radiology Results, Assessment/Plan   Last Updated: 28-Jun-14 09:29 by Verdie Shire (MD)

## 2014-10-29 NOTE — Consult Note (Signed)
PATIENT NAME:  Megan Mayer, Megan Mayer MR#:  [id number] DATE OF BIRTH:  May 26, 1966  DATE OF CONSULTATION:  03/05/2013  REFERRING PHYSICIAN:   CONSULTING PHYSICIAN:  Ezzard StandingPaul Y. Bluford Kaufmannh, MD  REASON FOR REFERRAL:  Acute pancreatitis.   HISTORY OF PRESENT ILLNESS:  The patient is Mayer 49 year old black female with Mayer known history of pancreatitis. She thought that she was initially diagnosed by 3 or 4 years ago. She use to abuse tobacco and alcohol but has not drank much the last year or two. She also apparently has Mayer history of C. diff as well. She was hospitalized in late June for Mayer bout of pancreatitis. Mayer CT scan at that time showed an abdominal mass at the head. She was then discharged after Mayer hospitalization and [facility] evaluation. Unfortunately, they only did an abdominal ultrasound instead. She has not been able to follow up because she does not have any insurance.   She was on some pain medication but ran out approximately Mayer month ago. Unfortunately, after she ran out of pain medication, the pain started to increase over time to the point that she had to come into the hospital for evaluation. She also complained of some abdominal bloating, but no nausea or vomiting. When she came in, her lipase was 3600. As Mayer result, the patient was admitted for further evaluation and treatment.   The patient was made n.p.o., given fluids. This morning she does feel better with less abdominal pain. She also admits to losing at least 30 pounds in the last several months.   PAST MEDICAL HISTORY:  Notable for hypertension and history of pancreatitis and C. diff.   FAMILY HISTORY:  Notable for brain cancer in mother.   SOCIAL HISTORY:  There is no tobacco or alcohol use. She is currently unemployed.   ALLERGIES:  No known drug allergies.   MEDICATIONS:  Include amlodipine 5 mg daily, clonidine 0.1 mg twice Mayer day, baby aspirin, hydrochlorothiazide/lisinopril 1 tablet Mayer day  and some pain medication, which she no longer takes. She has never been on any pancreatic enzyme supplementation.   REVIEW OF SYMPTOMS:  Again, there is Mayer 30 pound weight loss. Other than that, there is no change from the review of symptoms that was described and dictated on the day of admission. Please refer to this for further evaluation.   PHYSICAL EXAMINATION:  GENERAL:  The patient is in no acute distress. She is thin and somewhat cachectic. She is afebrile. Her vital signs are stable.  HEENT:  Normocephalic, atraumatic head. Pupils are equally reactive. Throat was clear.  NECK:  Supple.  CARDIAC:  Regular rate without murmurs.  LUNGS:  Clear bilaterally.  ABDOMEN:  Soft, nondistended. She has positive bowel sounds. There is no hepatomegaly. She has some tenderness in the epigastric region although it is not significant. There is no rebound or guarding.  EXTREMITIES:  No clubbing, cyanosis, or edema.  SKIN:  Negative.  NEUROLOGIC:  Nonfocal.   LABS/STUDIES:  Today sodium 137, potassium 3.4, chloride 106, CO2 22. Lipase was 3652 on admission, now it is 1157. Liver enzymes are normal. White count is 11.7 yesterday, 7.5 today, hemoglobin was 15.0. It is 11.8 today with IV hydration. There was some blood in her urine, some proteins as well. She had CA-19-9 level sent when she was in the hospital the last time. It was within normal limits.   Mayer CT of the abdomen was repeated. It showed  significant edema at the pancreatic head. The pancreatic duct appears dilated. She has calcifications. These at all consistent with chronic pancreatitis. There is no obvious mass. There is Mayer slight prominence of the intrahepatic ducts. The gallbladder appears normal.   ASSESSMENT AND PLAN:  This is Mayer patient with probably acute on chronic pancreatitis. She is feeling better. I would recommend continuing pain medication, continue IV fluids, and n.p.o. the rest of today. If her numbers continue to improve with Mayer  symptomatic improvement then perhaps we can start the clear liquids tomorrow. The patient does need to have an endoscopic ultrasound scheduled at some point, either here or elsewhere. It was supposed to have faculty from [facility] coming to [facility] starting in October to do endoscopic ultrasound. I do not have the details as to when. Perhaps we can have her get it done here when [facility] faculty shows up. I would also recommend starting some pancreatic enzyme once she starts eating to help with her digestion of food, particularly fatty foods. I will continue to follow the patient. Thank you for the referral.  ____________________________ Ezzard Standing. Bluford Kaufmann, MD pyo:jm D: 03/05/2013 12:44:53 ET T: 03/05/2013 13:16:10 ET JOB#: [id number]  cc: Ezzard Standing. Bluford Kaufmann, MD, <Dictator> Ezzard Standing Wendelin Bradt MD ELECTRONICALLY SIGNED 03/06/2013 8:50

## 2014-10-29 NOTE — Consult Note (Signed)
Chief Complaint:  Subjective/Chief Complaint Doing better. Min abd pain. Tolerating full liquid diet.   VITAL SIGNS/ANCILLARY NOTES: **Vital Signs.:   29-Aug-14 09:00  Vital Signs Type Recheck  Temperature Temperature (F) 98.3  Celsius 36.8  Temperature Source oral  Pulse Pulse 101  Respirations Respirations 17  Systolic BP Systolic BP 950  Diastolic BP (mmHg) Diastolic BP (mmHg) 96  Mean BP 121  Pulse Ox % Pulse Ox % 99  Pulse Ox Activity Level  At rest  Oxygen Delivery Room Air/ 21 %   Brief Assessment:  GEN no acute distress   Cardiac Regular   Respiratory clear BS   Gastrointestinal min tenderness   Lab Results: Hepatic:  29-Aug-14 06:05   Bilirubin, Total 0.6  Alkaline Phosphatase 107  SGPT (ALT) 13  SGOT (AST) 22  Total Protein, Serum 6.5  Albumin, Serum  3.0  Routine Chem:  29-Aug-14 06:05   Glucose, Serum  56  BUN  4  Creatinine (comp)  0.54  Sodium, Serum  135  Potassium, Serum  2.8  Chloride, Serum 103  CO2, Serum  20  Calcium (Total), Serum 8.8  Osmolality (calc) 265  eGFR (African American) >60  eGFR (Non-African American) >60 (eGFR values <47m/min/1.73 m2 may be an indication of chronic kidney disease (CKD). Calculated eGFR is useful in patients with stable renal function. The eGFR calculation will not be reliable in acutely ill patients when serum creatinine is changing rapidly. It is not useful in  patients on dialysis. The eGFR calculation may not be applicable to patients at the low and high extremes of body sizes, pregnant women, and vegetarians.)  Anion Gap 12  Lipase  636 (Result(s) reported on 06 Mar 2013 at 07:01AM.)   Assessment/Plan:  Assessment/Plan:  Assessment Pancreatitis. Resolving.   Plan If remains stable on diet, ok for discharge. Make sure patient goes home on pancreatic supplements, ex creon 72000 units before meals and 36000 units before snacks. Make sure patient has f/u with [facility]. Will need EUS arranged as  outpt. Will sign off. Thanks.   Electronic Signatures: OVerdie Shire(MD)  (Signed 29-Aug-14 12:43)  Authored: Chief Complaint, VITAL SIGNS/ANCILLARY NOTES, Brief Assessment, Lab Results, Assessment/Plan   Last Updated: 29-Aug-14 12:43 by OVerdie Shire(MD)

## 2014-10-29 NOTE — Discharge Summary (Signed)
PATIENT NAME:  [name], [name] MR#:  Mayer DATE OF BIRTH:  1965/09/15  DATE OF ADMISSION:  01/01/2013  DATE OF DISCHARGE:  01/03/2013  CHIEF COMPLAINT:  Abdominal pain over 2 weeks.   HOSPITAL COURSE: This is a 49 year old female with history of hypertension, which is untreated, not compliant with treatment in general. She has history of acute on chronic pancreatitis in the past. She has been complaining of epigastric pain for 2 weeks and some right lower chest pain. The patient had some nausea, but no vomiting. No diarrhea. No hematemesis. She has had a significant weight loss of 30 pounds within 2 weeks. She has a very low appetite with early satiety. The patient has had an ultrasound of the abdomen done in the Emergency Room showing a pancreatic head mass of 6.6 cm, with dilated CBD of 9.7 cm. The patient had similar episode a year back, and she was diagnosed with unknown etiology of pancreatitis. When the patient was admitted, her blood pressure was significantly elevated, 205/113, likely related to noncompliance with medications as well as increased pain.   The patient, for her acute pancreatitis and pancreatic mass, was admitted, put on IV fluids, n.p.o., advance diet was slow. She required medications IV for pain control and for blood pressure control. At this moment, the patient is taking clonidine, hydrochlorothiazide and lisinopril, and we added on today amlodipine for her to take home. Medications for all these have been given. The patient has been told on several occasions that she needs an ultrasound of the pancreas that needs to be done endoscopically. Unfortunately, we are not able to do it over here. Dr. Marva PandaSkulskie and Dr. Bluford Kaufmannh have seen the patient, and they recommended this test. The patient is to get this set up within the next week.   As far as her hypertension urgency, has improved. Continue medications mentioned above. As far as her tobacco use, patient was extensively counseled  about stopping tobacco. Her creatinine was 0.64 at discharge, her magnesium was 1.5, and it was repleted via IV. Her lipase on admission was 3200, at discharge is 794. Her LFTs were within normal limits. Her hemoglobin on admission was 12.3, at discharge 10.6, and this is due to dilution from IV fluids. White count is 5.7. UA had 3 white blood cells, 1 red blood cell. CA19-9 was 27, which is normal.   The patient is doing much better. We are going to discharge her as soon as she is tolerating a diet this afternoon. Follow up with primary care physician as well as ultrasound of the pancreas via endoscopy [facility] [place].   I spent about 40 minutes with this discharge.    ____________________________ Megan Furnaceoberto Sanchez Gutierrez, MD rsg:mr D: 01/03/2013 10:48:57 ET T: 01/03/2013 20:49:50 ET JOB#: [id number]  cc: Megan Furnaceoberto Sanchez Gutierrez, MD, <Dictator> Yarissa Reining Juanda ChanceSANCHEZ GUTIERRE MD ELECTRONICALLY SIGNED 01/07/2013 15:09

## 2014-10-29 NOTE — Consult Note (Signed)
Brief Consult Note: Diagnosis: pancreatitis, possible pancreatic mass.   Patient was seen by consultant.   Recommend to proceed with surgery or procedure.   Recommend further assessment or treatment.   Comments: Pat;ietn seen and examined, full consult to follow.  47 yof admitted with epigastric pain.  elevated lipase.  CT and US c/w possible mass in the pancreatic head.  inflammatory versus neoplastic.  Patient with similar episode 07/2010 with dx of biliary pancreatitis.  Will need to have endoscopic us with possible bx.  This could be arranged as o/p versus transport [facility].  Continue curretn treatment of pancreatitis, she is feeling a bit better, but still very tender to palpation.  Woll discuss with [facility].  Electronic Signatures for Addendum Section:  Barnetta ChapelSkulskie, Martin (MD) (Signed Addendum 27-Jun-14 17:32)  PLEASE SEE FULL GI CONSULT #[id number]   Electronic Signatures: Barnetta ChapelSkulskie, Martin (MD)  (Signed 27-Jun-14 07:26)  Authored: Brief Consult Note   Last Updated: 27-Jun-14 17:32 by Barnetta ChapelSkulskie, Martin (MD)

## 2014-11-01 LAB — SURGICAL PATHOLOGY

## 2014-11-07 NOTE — Consult Note (Signed)
Brief Consult Note: Diagnosis: epigastric pain/pancreatitis.   Patient was seen by consultant.   Consult note dictated.   Comments: Appreciate consult for 48y/p African American woman with hx htn and documented hx chronic pancreatitis for evaluation of pancreatitis. States she has been on Creon since 2013 but denies knowledge of her hx chronic pancreatitis. Denies etoh- evidently drank this 94yr ago. Denies illicits. No really suspicious medications with the exception of lisinopril- however this is rare.  denies HL/gallstone dz, fh pancreatic/GI disorders. Reports intermittent dyspepsia, epigastric pain over the last several months. Tried BC powders but this made it worse. feels like she has knots in her abdomen. States she takes her creon a few minutes prior to meals and food does not bother her, however has lost about 60lb over the last several months and gets full easily. States her eyes were "green" 3w ago, but this resolved on its own. Feels bloated frequently. Epigastric pain exacerbated yest, unsure why. Associated with some NV- whitish &  no hematemesis. Feeling better now. No GI complaints presently. Noted on CT pancreas with changes of chronic pancreatitis with several pseudocysts: some near head of panc that interfere with CBD and cause dilation, 10cm lobulated cyst pressing on stomach, and 13cm cyst near the infer0posterior gastric body.  There was small amt of  distal wall thickening in the colon. ALP quite elevated, >1000. Bilirubin normal. Lipase 488. ASt 39, ALT normal. No history of luminal evaluation. Platelets normal. denies history of liver disease. Impression and plan: pancreatitis, elevated ALP, pancreatic pseudocysts. Suspect pseudocystst are causing compression on biliary ducts, which have led to her current pancreatitis flare and elevated ALP. Surgical consult pending. She likely has > pseudocysts for EUS to be of much value: may need surgical procedure. Recommend hydration and pain  control for now. She is on H2RA for ulcer prophylaxis/GERD presently. Triglycerides, ana, Igg4 Regarding her distal colon wall thickening: recommend colonoscopy when clinically feasible..  Electronic Signatures: Vevelyn PatLondon, Maalik Pinn H (NP)  (Signed 20-Jan-16 17:20)  Authored: Brief Consult Note   Last Updated: 20-Jan-16 17:20 by Keturah BarreLondon, Seairra Otani H (NP)

## 2014-11-07 NOTE — Consult Note (Signed)
Pt seen and examined. Please see C.London's notes. 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 Agree with Dr. Anda KraftMarterre that endoscopic drainage via EUS is not feasible for multiple large pseudocysts. Agree that surgery is the only viable option at this point. Once pseudocyst is drained, then biliary obstruction should resolve. Will sign off. Thanks.  Electronic Signatures: Lutricia Feilh, Elnathan Fulford (MD)  (Signed on 20-Jan-16 16:10)  Authored  Last Updated: 20-Jan-16 16:10 by Lutricia Feilh, Tiffannie Sloss (MD)

## 2014-11-07 NOTE — Op Note (Signed)
PATIENT NAME:  Megan Mayer, Megan Mayer MR#:  [id number] DATE OF BIRTH:  02-08-1966  DATE OF PROCEDURE:  07/30/2014  OPERATION PERFORMED:  1. Pancreatic pseudocyst just gastrostomy.  2. Pancreatic pseudocyst jejunostomy Roux-en-Y x 2.  3. Jejunostomy feeding tube placement.  4. Open cholecystectomy.   SURGEON: Consuela Mimes, MD.   FIRST ASSISTANT: Lew Dawes. Oaks, MD.  ANESTHESIA: General.   PROCEDURE IN DETAIL: The patient was placed supine on the operating room table and prepped and draped in the usual sterile fashion. A long midline incision was made and carried down through the linea alba with the electrocautery and the peritoneum was entered carefully. The patient's major pseudocyst was presenting through the transverse mesocolon in the greater cavity, and this was drained by removing a small sliver of full-thickness cyst wall through a circular incision that was approximately 2.5 to 3 cm in greatest dimension. This cyst drained 750 mL of clear fluid, and there was no solid material within the cyst cavity. The patient also had a periduodenal cyst, and this was also drained through the transverse mesocolon, just near the hepatic flexure.  The cyst wall here was quite thick and it, too, drained clear fluid with no solid material present. It was a total of 250 mL. The patient's 3rd pseudocyst was very high in the left upper quadrant, enveloped by the spleen and the proximal stomach; and although I could feel it through the transverse mesocolon after the other 2 cysts had been drained, the splenic artery and vein were present right in that area, as was a left colic vessel and therefore, I elected to drain that one through the stomach. I made a longitudinal anterior gastrotomy in the proximal half of the stomach and up near the cardia, and then performed the pseudocyst gastrostomy through the posterior wall of the stomach, where I excised a 2.5 cm diameter circle of posterior stomach wall and pseudocyst  wall. This cyst drained 200 mL of mostly clear fluid, but it also had some chunky black material, consistent with old blood. All of that was adequately drained. I matured the cyst gastrostomy with a single layer of  running 2-0 silk suture line about every other stitch , which was locked. I then closed the anterior gastrotomy with a TA-90 stapling device in a longitudinal orientation and oversewed this entire staple line with seromuscular Lembert sutures of 2-0 silk. I then found a suitable location in the very 1st loop of the jejunum for feeding tube placement and traced distally about 15 cm and divided the end of the 1st loop of jejunum with the GIA stapling device and took down the mesentery with combination of the electrocautery and the Harmonic scalpel. I then performed a 50 cm long Roux-en-Y limb and generated the enteroenterostomy with a GIA stapling device on the antimesenteric surface of each and closed the resultant enterotomy with a TA-60 stapler. I placed a 3-0 silk seromuscular Lembert suture at the apex of the GIA staple line and another one where the 2 staple lines met at the heel of the anastomosis. The Roux-en-Y limb was then delivered over to the right side of the abdomen, where the periduodenal cyst had been drained, and I performed a pseudocyst gastrostomy there with a single layer of 2-0 silk suture from full-thickness intestine to full-thickness cyst wall and then performed an identical procedure on the major cyst to the same Roux limb in a sequential fashion, approximately 10 cm from the tip of the Roux limb and the  other anastomosis. I then placed a 14 French red rubber catheter through the left flank and into the 1st loop of the jejunum and threaded that through the enteroenterostomy into the efferent  limb of intestine. I closed this with a 7 or 8 cm along Witzel tunnel, using 3-0 silk seromuscular Lembert sutures and used the last 2 sutures to secure the jejunum to the abdominal wall,  where the feeding tube entered it. I then placed a large Blake drain through the right flank and underneath both of the pancreatic pseudocyst jejunostomy anastomoses, such that the tip of the Blake drain was actually very close to where the jejunostomy feeding tube entered the jejunum on the left side. I secured both the feeding tube and the drain to the skin with 2-0 nylon sutures, and then performed an open cholecystectomy by ligating the cystic artery with a 2-0 silk ligature, the cystic duct with a 2-0 silk ligature, and removing the gallbladder from the liver bed with the electrocautery. This completed the procedure. The transverse colon and very thin omentum were then draped over top of the remaining small intestine, down into the pelvis, and the fascia was closed with a running #1 PDS suture, and the skin was reapproximated with a skin stapling device and a sterile dressing was applied. The patient tolerated the procedure well. There were no complications.    ____________________________ Consuela Mimes, MD wfm:mw D: 07/30/2014 12:14:47 ET T: 07/30/2014 12:28:07 ET JOB#: [id number]  cc: Consuela Mimes, MD, <Dictator> Consuela Mimes MD ELECTRONICALLY SIGNED 07/30/2014 13:54

## 2014-11-07 NOTE — H&P (Signed)
PATIENT NAME:  Megan Mayer, AVENI MR#:  [id number] DATE OF BIRTH:  1966/02/05  DATE OF ADMISSION:  07/28/2014  REFERRING PHYSICIAN: Rebecka Apley, MD   PRIMARY CARE PRACTITIONER: [facility].   ADMITTING PHYSICIAN: Crissie Figures, MD    CHIEF COMPLAINT: Epigastric pain associated with some nausea and vomiting.    HISTORY OF PRESENT ILLNESS: A 49 year old [place] American female with a past medical history chronic pancreatitis, hypertension, presents with the complaints of worsening epigastric pain associated with nausea and vomiting. The patient stated that she has been having the epigastric pain for the past many months, which further worsened with associated nausea and vomiting yesterday. Hence, she came to the Emergency Room for further evaluation. No fever. No chest pain. No shortness of breath. No diarrhea. No urinary symptoms. In the Emergency Room, the patient was evaluated by ED physician and was found to have elevated lipase of 448 and elevated alkaline phosphatase of 1208. She underwent a CT of the abdomen, which revealed a significant biliary dilatation secondary to obstruction from pseudocyst of the pancreas. The CT scan also shows multiple pseudocysts of the pancreas. Surgical service was consulted by the ED physician and they recommended admission to the medical floor and they will follow as a consultation. The patient received multiple doses of IV medications in the Emergency Room and, at this current time, the pain is under reasonable control. She also noted to have elevated blood pressure, primarily secondary due to the pain. Denies any headache.    PAST MEDICAL HISTORY:  1.  Hypertension.  2.  Chronic pancreatitis.   PAST SURGICAL HISTORY: No history of any surgeries.  ALLERGIES: No known drug allergies.   FAMILY HISTORY: Positive for brain cancer in the patient's mother.   SOCIAL HISTORY: She is single and lives with her boyfriend. History of smoking present;  she smokes on and off. History of alcohol usage,  she states she quit 5 years ago. Denies any substance use.    HOME MEDICATIONS:  1.  Amlodipine 10 mg tablet 1 tablet orally once a day. 2.  Clonidine 0.1 mg tablet 1 tablet orally 3 times a day.  3.  Coreg 6.25 mg 1 tablet orally 2 times a day.  4.  Lisinopril 40 mg 1 tablet orally once a day.  5.  Pancrelipase 12,000 units/38,000 units/60,000 units capsule 1 capsule orally 4 times a day.    REVIEW OF SYSTEMS:  CONSTITUTIONAL: Negative for fever, chills. No fatigue. No generalized weakness.  EYES: Negative for blurred vision, double vision. No pain. No redness. No discharge.  EARS, NOSE, AND THROAT: Negative for tinnitus, ear pain, hearing loss. No epistaxis. No nasal discharge. No difficulty swallowing.  RESPIRATORY: Negative for cough, wheezing, dyspnea, hemoptysis, painful respiration.  CARDIOVASCULAR: Negative for chest pain, palpitations, dizziness, syncopal episodes, orthopnea, dyspnea on exertion, or pedal edema.  GASTROINTESTINAL: Positive for epigastric pain, which is worsening associated episode of nausea and vomiting yesterday. Today no nausea and vomiting. No constipation. No diarrhea. No rectal bleeding.  GENITOURINARY: Negative for dysuria, frequency, urgency, or hematuria.  ENDOCRINE: Negative for polyuria, nocturia, heat or cold intolerance.  HEMATOLOGIC AND LYMPHATIC: Negative for anemia, easy bruising, bleeding or swollen glands.  INTEGUMENTARY: Negative for acne, skin rash, or lesions.  MUSCULOSKELETAL: Negative for neck pain, back pain, shoulder pain. No history of arthritis or gout.  NEUROLOGICAL: Negative for focal weakness or numbness. No tremors. No history of CVA, TIA, seizure disorder.  PSYCHIATRIC: Negative for anxiety, insomnia, depression.  PHYSICAL EXAMINATION:  VITAL SIGNS: Temperature 97 degrees Fahrenheit, pulse rate 69 per minute, respirations 16 per minute, blood pressure 155/109, oxygen saturation 100% on  room air.  GENERAL: Well developed, well nourished, alert and in no acute distress at this time.  HEAD: Atraumatic, normocephalic.  EYES: Pupils are equal, react to light and accommodation. No conjunctival pallor. No scleral icterus. Extraocular movements intact.  NOSE: No drainage. No lesions.  EARS: No drainage. No external lesions.  ORAL CAVITY: No mucosal lesions. No exudates.  NECK: Supple. No JVD. No thyromegaly. No carotid bruit. Range of motion of neck within normal limits.  RESPIRATORY: Good respiratory effort. Not using accessory muscles of respiration. Bilateral vesicular breath sounds present. No rales or rhonchi.  CARDIOVASCULAR: S1, S2 regular. No murmurs, gallops, or clicks appreciated. Peripheral pulses equal at carotid, femoral, and pedal pulses. No peripheral edema.  GASTROINTESTINAL: Abdomen moderate tenderness in the epigastric region with mild distention. Fullness in the epigastrium present. Mild guarding present. Bowel sounds present and equal in all 4 quadrants. No rigidity.  GENITOURINARY: Deferred.  MUSCULOSKELETAL: No joint tenderness or effusion. Range of motion is adequate. Strength and tone equal bilaterally.  SKIN: Inspection within normal limits. No obvious wounds. LYMPHATIC: No cervical lymphadenopathy.  VASCULAR: Good dorsalis pedis and posterior tibial pulses.  NEUROLOGICAL: Alert, awake, and oriented x 3. Cranial nerves II-XII grossly intact. No sensory deficit. Motor strength is 5/5 in both upper and lower extremities. DTRs 2+ bilaterally and symmetrical.  PSYCHIATRIC: Alert, awake, and oriented x 3. Judgment, insight adequate. Memory and mood within normal limits.   ANCILLARY DATA:  LABORATORY DATA: Serum glucose 92, BUN 5, creatinine 0.68, sodium 140, potassium 2.9, chloride 105, bicarbonate 25, total calcium 7.5, lipase 448, total protein 6.2, albumin 2.2, total bilirubin 0.8, alkaline phosphatase 1208, AST 39, ALT 39. Troponin less than 0.02. CBC. 8.4,  hemoglobin 12.4, hematocrit 37.3, platelet count 485,000.  URINALYSIS: No bacteria.  IMAGING STUDIES: CT of the abdomen and the pelvis with contrast:  1.  Progressively intra and extrahepatic biliary dilation secondary due to mild mass effect on the distal common bile duct by a large pancreatic pseudocyst. No calcified stone.  2.  Pancreas: Chronic pancreatitis with diffuse parenchymal calcifications and chronic ductal enlargement, new enlarged pseudocyst including 8 cm pseudocyst within the pancreatic head, which obstructs the distal common bile duct and narrows the portal vein and upper SMV. There is a 10 cm pseudocyst posterior to the proximal stomach causing submucosal edema distortion of the stomach. Separate more inferior gastric pseudocyst, which measures 13 cm that flattens the gastric body and antrum.  3.  Impression:  a.  Chronic pancreatitis with pseudocysts that have formed or enlarged since 2014.  b.  Distal colitis.   EKG: Normal sinus rhythm with ventricular rate of 68 beats per minute, no acute ST-T changes.    ASSESSMENT AND PLAN: A 49 year old [place] American female with a past medical history of chronic pancreatitis, hypertension who presents with worsening abdominal pain with associated nausea and vomiting, found to have elevated lipase and elevated alkaline phosphatase and CT of the abdomen shows chronic pancreatitis with pseudocysts causing mass effect on surrounding  structures.   1.  Worsening  epigastric pain secondary due to acute on chronic pancreatitis needing intravenous pain medications.  2.  Acute on chronic pancreatitis. PLAN: Admit to medical/surgical floor, n.p.o., except medications, intravenous pain medications, IV fluids. Surgical consultation and gastrointestinal consultation requested for further management. We will followup lipase and LFTs.  3.  Pseudocyst  of the pancreas with mass effect on surrounding structures. PLAN: Surgical consultation and  gastrointestinal consultation for further input.  4.  Elevated liver function tests, that is alkaline phosphatase, secondary due to chronic pancreatitis and mass effect from pancreatic pseudocyst.  PLAN: Surgical and gastrointestinal consultation requested.  5.  Hypertension. The patient on home medications. Blood pressure suboptimally controlled likely secondary due to pain. PLAN: Control pain better. Continue home medications and consider intravenous hydralazine as needed.   6.  Hypokalemia. PLAN: Replace potassium. Follow BMP.  7.  Deep vein thrombosis prophylaxis: Subcutaneous Lovenox.  8.  Gastrointestinal prophylaxis: Protonix.   CODE STATUS: Full code.   TIME SPENT: 55 minutes.    ____________________________ Crissie FiguresEdavally N. Deazia Lampi, MD enr:bm D: 07/28/2014 07:24:20 ET T: 07/28/2014 07:59:43 ET JOB#: [id number]  cc: Crissie FiguresEdavally N. Daimon Kean, MD, <Dictator> [facility] Crissie FiguresEDAVALLY N Suhan Paci MD ELECTRONICALLY SIGNED 07/28/2014 20:05

## 2014-11-07 NOTE — Discharge Summary (Signed)
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   OTHER DIAGNOSIS: Hypertension.  PRINCIPAL PROCEDURE PERFORMED DURING THIS ADMISSION: On 07/30/2014:  1.  Pancreatic pseudocystgastrostomy.  2.  Pancreatic pseudocystjejunostomy, Roux-en-Y x 2. 3.  Jejunostomy feeding tube placement.  4.  Open cholecystectomy.   HOSPITAL COURSE: The patient was admitted to the hospital and had a gentle bowel prep and underwent the above-mentioned procedure for the above-mentioned diagnosis. By postoperative day 2, she was tolerating clear liquids without nausea or early satiety like she had preoperatively, but she was burping some. She was passing lots of flatus, but she had not had a bowel movement, and therefore her diet was advanced to full liquids and her tube feedings, which had been started at 10 mL/h postoperatively were increased to 20. She was given a Dulcolax suppository, some lactulose and her PCA was decreased, and p.o. analgesics were added. The following day, she had some abdominal distention and on postoperative day 4 her blood pressure was better controlled, but she still had a little bit of nausea. Her tube feedings had been placed on hold and she was given another Dulcolax suppository, and on postoperative day 5 she was passing flatus, having bowel movements, her abdomen was soft. The drainage from her Jackson-Pratt drain was serous and amylase was checked and it was low. Her diet was advanced, her tube feedings were discontinued, and by the 29th she was discharged. Her medications at the time of discharge were unchanged from her preoperative medication regimen and included amlodipine 10 mg daily, lisinopril 40 mg daily, Coreg 6.25 mg b.i.d., clonidine 0.1 mg t.i.d., pancrelipase 12,000 units delayed release 1 capsule q.i.d., and  Norco 5/325 q. 4 h p.r.n. She was asked to make an appointment to see me and to call the office in the interim for any problems.    ____________________________ Claude MangesWilliam F. Nyhla Mountjoy, MD wfm:TT D: 08/11/2014 19:47:11 ET T: 08/11/2014 21:19:20 ET JOB#: [id number]  cc: Claude MangesWilliam F. Ronold Hardgrove, MD, <Dictator> Claude MangesWILLIAM F Sarajane Fambrough MD ELECTRONICALLY SIGNED 08/12/2014 20:56

## 2014-11-07 NOTE — Consult Note (Signed)
PATIENT NAME:  Megan Mayer, ESPIRITU MR#:  [id number] DATE OF BIRTH:  [birth year]  DATE OF CONSULTATION:  07/28/2014  REFERRING PHYSICIAN: Crissie Figures, MD  CONSULTING PHYSICIAN:  Keturah Barre, NP  REASON FOR CONSULTATION: GI consult ordered by Dr. Betti Cruz for evaluation of pancreatitis.   HISTORY OF PRESENT ILLNESS: I appreciate consult for 49 year old [place] American woman with history of hypertension documented, history of chronic pancreatitis for evaluation of pancreatitis. States she has been on Creon since 2013, but denies knowledge of her history of chronic pancreatitis. She denies any alcohol use. Evidently, drank 5 years ago, but not further. Denies illicits. No really suspicious medications with the exception of lisinopril; however, this is rare. Denies hyperlipidemia, gallstone disease, family history of pancreatic/GI disorders. Reports intermittent dyspepsia, epigastric pain over the last several months, tried [facility] powders but this made it worse. Feels like she has knots in her abdomen. States she takes Creon a few minutes prior to meals and food does not bother her; however, she has lost about 60 pounds over the last several months and gets full easily. States her eyes were green 3 weeks ago, but this resolved on its own. Feels bloated frequently. Epigastric pain exacerbated yesterday, unsure why. Associated with some nausea and vomiting, whitish, and no hematemesis. Feeling better now. She has no GI complaints presently. Noted on her recent CT, her pancreas has changes of chronic pancreatitis with several pseudocysts, some near the head of the pancreas that interfere with the common bile duct and cause dilation. There is a 10 cm lobulated cyst pressing on her stomach and a 13 cm cyst near the inferoposterior gastric body. There is a small amount of distal wall thickening in the colon. Her ALP has been quite elevated, greater than 1000, bilirubin normal, lipase 488, AST 39, ALT normal.  There is no history of luminal evaluation. Her platelets are normal. She denies history of liver disease.   PAST MEDICAL HISTORY: Hypertension, chronic pancreatitis.   PAST SURGICAL HISTORY: No known surgeries.   ALLERGIES: NKDA.   FAMILY HISTORY: Significant for brain cancer in her mother. Denies family history of colorectal cancer, colon polyps, liver disease, pancreatic disease.   SOCIAL HISTORY: Single, lives with boyfriend. Smokes cigarettes. Stopped using alcohol 5 years ago. No illicits.   HOME MEDICATIONS: Amlodipine 10 mg once a day, clonidine 0.1 mg t.i.d., Coreg 6.25 mg 1 b.i.d., lisinopril 40 mg once a day, pancrelipase 12,000 units/38,000 units/60,000 units 1 capsule 4 times a day before meals.   REVIEW OF SYSTEMS: Ten systems reviewed, unremarkable other than what is noted above.   LABORATORY DATA: Most recent laboratories: Glucose 92, BUN 5, creatinine 0.68, sodium 140, potassium 2.9, chloride 105, GFR greater than 60. Lipase 488, total protein 6.2, albumin 2.2, total bilirubin 0.8, ALp >1000, AST 39, ALT 39. Troponin less than 0.02. WBC 8.4, hemoglobin 12.2, hematocrit 37.3, platelet count 485,000. Red cells normocytic with normal RDW. CT scan as noted above.   PHYSICAL EXAMINATION:  VITAL SIGNS: Most recent: Temperature 98, pulse 63, respiratory rate 20, blood pressure 160/97, SaO2 of 100%.  GENERAL: Thin, well-appearing, middle-aged woman in no acute distress.  HEENT: Normocephalic, atraumatic. Sclerae clear, conjunctivae pink.  NECK: Supple. No JVD, thyromegaly, adenopathy.  CHEST: Respirations eupneic. Lungs clear bilaterally.  CARDIAC: S1, S2, RRR. No MRG. Peripheral pulses 2+. No appreciable edema.  ABDOMEN: Protuberant abdomen. Soft, nontender. Abdomen has an irregular texture on examination, however, there are no fixed or discrete masses present. Unable to appreciate any  hepatosplenomegaly.  SKIN: Warm, dry, pink. No erythema, lesion or rash.  NEUROLOGICAL: Alert,  oriented x 3. Cranial nerves II-XII intact.  EXTREMITIES: MAEW x 4. No clubbing or cyanosis.  PSYCHIATRIC: Pleasant, calm, logical train of thought, good memory skills.   IMPRESSION AND PLAN: Chronic pancreatitis exacerbation, elevated ALP, pancreatic pseudocysts. Suspect pseudocysts are causing compression on biliary ducts and the stomach which have led to her current symptoms and pancreatitis flare and elevated ALP. A surgical consult is pending. She likely has too many pseudocyst for EUS to be of much value, may need surgical procedure. Recommend hydration and pain control for now. She is on H2RA for ulcer prophylaxis/gastroesophageal reflux disease presently. We will assess triglycerides, ANA, IgG4. Regarding her distal colon wall thickening, recommend colonoscopy when clinically feasible. This can be done as an outpatient.   Thank you very much for this consult.   These services were provided by Vevelyn Pathristiane Nastacia Raybuck, MSN, [facility], in collaboration with Dr. Lutricia FeilPaul Oh, MD, with whom I have discussed this patient in full. Thank you very much for this consult.    ____________________________ Keturah Barrehristiane H. Nikeshia Keetch, NP chl:TT D: 07/28/2014 22:26:00 ET T: 07/28/2014 22:52:16 ET JOB#: [id number]  cc: Keturah Barrehristiane H. Vaibhav Fogleman, NP, <Dictator> Eustaquio MaizeHRISTIANE H Rawley Harju FNP ELECTRONICALLY SIGNED 08/23/2014 15:17

## 2014-11-07 NOTE — Consult Note (Signed)
Brief Consult Note: Diagnosis: Multiple huge pancreatic pseudocysts with mass effects.   Comments: Although I have only seen her CT thus far, this appears to be pancreatic disease that is amenable to surgery. Percutaneous drainage of these cysts is contraindicated as that may only convert them to pancreatic fistulae and it is not a durable solution. Endoscopic drainage, also, is less durable and will only introduce bacteria into otherwise sterile cysts. I will see her later this afternoon and discuss her options with her. In the meantime, I have ordered some lactulose for a slow gentle bowel prep, and I appreciate your help with her hypokalemia and HTN management. If she agrees to an operation, I will place a surgical enteral feeding tube for postop nutrition at the time of surgery.  Electronic Signatures: Claude MangesMarterre, Haruko Mersch F (MD)  (Signed 20-Jan-16 07:40)  Authored: Brief Consult Note   Last Updated: 20-Jan-16 07:40 by Claude MangesMarterre, Lamae Fosco F (MD)

## 2015-03-02 ENCOUNTER — Ambulatory Visit
Admission: RE | Admit: 2015-03-02 | Discharge: 2015-03-02 | Disposition: A | Discharge status: Home / Self Care | Payer: Self-pay | Source: Ambulatory Visit | Attending: Oncology | Admitting: Oncology

## 2015-03-02 ENCOUNTER — Ambulatory Visit: Payer: Self-pay | Attending: Oncology

## 2015-03-02 VITALS — BP 98/66 | HR 82 | Temp 98.8°F | Resp 18 | Ht 62.6 in | Wt 85.0 lb

## 2015-03-02 DIAGNOSIS — Z0000 Encounter for general adult medical examination without abnormal findings: Secondary | ICD-10-CM | POA: Insufficient documentation

## 2015-03-03 NOTE — Progress Notes (Signed)
Subjective:     Patient ID: Megan Mayer, female   DOB: 1966-04-28, 49 y.o.   MRN: [id number]  HPI   Review of Systems     Objective:   Physical Exam     Assessment:     49 year old patient presents for [facility] clinic visit.  Patient screened, and meets BCCCP eligibility.  Patient does not have insurance, Medicare or Medicaid.  Handout given on Affordable Care Act.  Patient is emaciated, weighing 84 pounds.  States she had surgery in January for pancreatic cysts.  Has tried to follow-up at [facility], but appointments have been cancelled.  Referred back to PCP at [facility]) to make referral to [facility].  Instructed patient to be sure to notify of symptoms of    CBE unremarkable   Instructed patient on breast self-exam using teach back method.   Pelvic exam normal.     Plan:     Sent for bilateral screening mammogram.   Specimen collected for pap.

## 2015-03-08 LAB — PAP LB AND HPV HIGH-RISK
HPV, high-risk: NEGATIVE
PAP Smear Comment: 0

## 2015-03-11 NOTE — Progress Notes (Signed)
Phoned patient with normal mammogram, and pap results.  Patient to return for mammographic annual screening in one year.  Next pap smear due in 5 years. Copy to [facility].

## 2016-04-24 IMAGING — CT CT ABD-PELV W/ CM
2 of 10 series · 13 of 46 positions shown, 18 images · IV contrast (omnipaque)
Comparison: 03/04/2013

CLINICAL DATA: Dizziness and epigastric pain. History of
pancreatitis.

EXAM:
CT ABDOMEN AND PELVIS WITH CONTRAST
TECHNIQUE: Multidetector CT imaging of the abdomen and pelvis was performed
using the standard protocol following bolus administration of
intravenous contrast.
CONTRAST:  80 cc Omnipaque 350 intravenous

[Series 8: pancreas venous thins · axial · portal-venous · 0.57mm/px · z∈[-418,-96]mm · 10 of 258 slices shown, 15 images]
[im 22/258  soft-tissue]
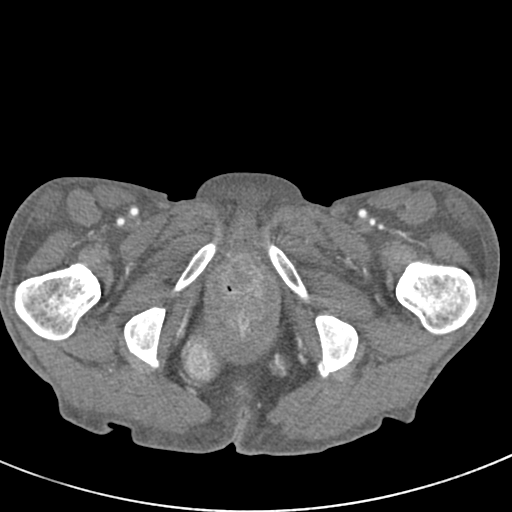
[im 22/258  bone]
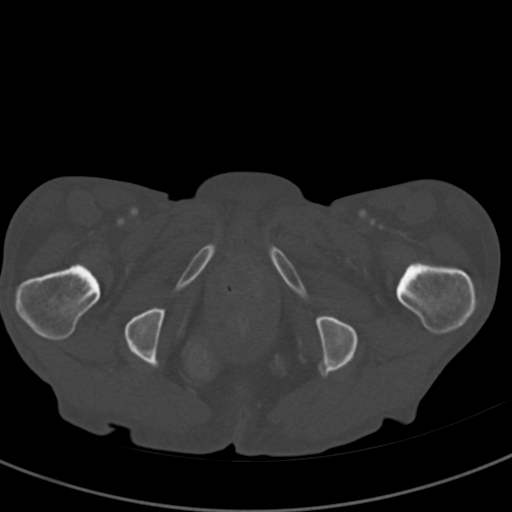
[im 43/258  soft-tissue]
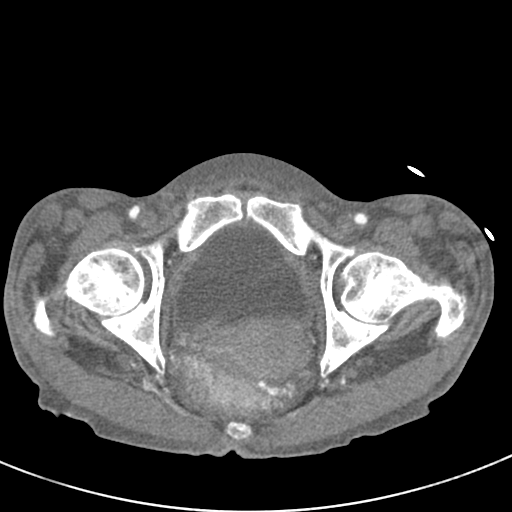
[im 86/258  soft-tissue]
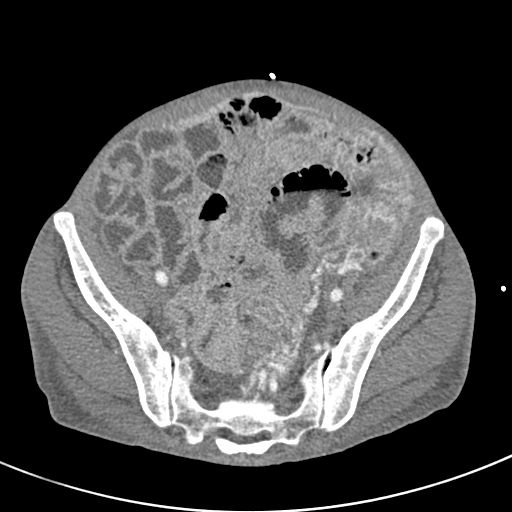
[im 108/258  soft-tissue]
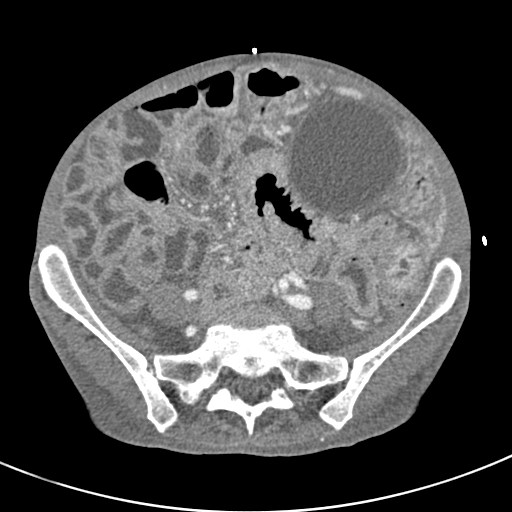
[im 129/258  soft-tissue]
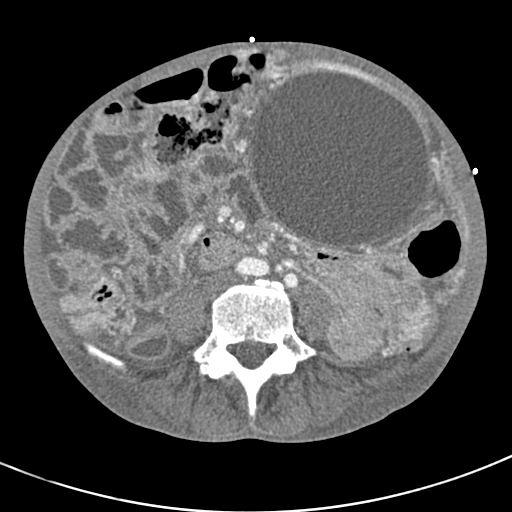
[im 150/258  soft-tissue]
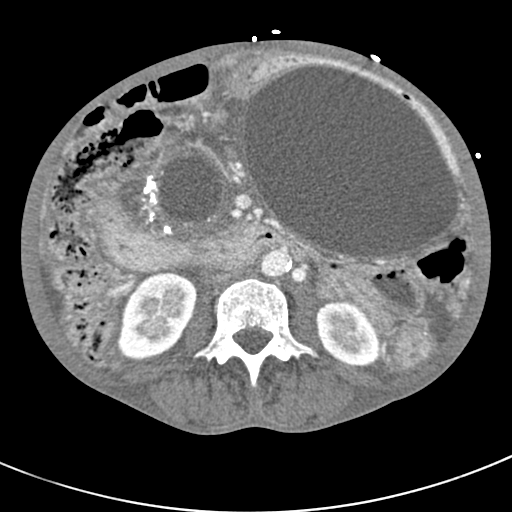
[im 172/258  soft-tissue]
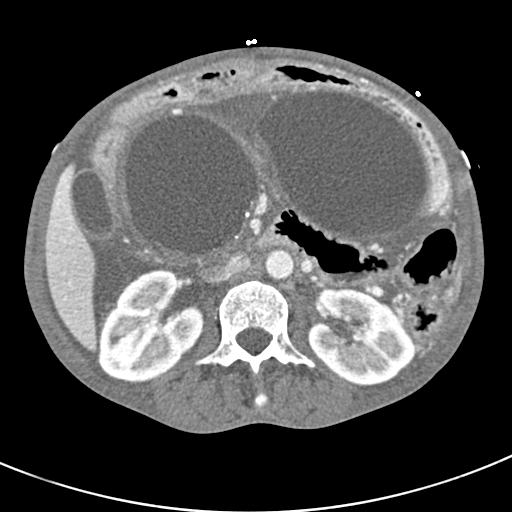
[im 172/258  lung]
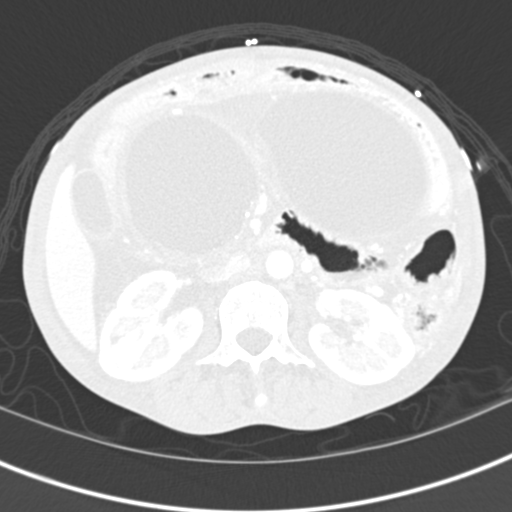
[im 193/258  lung]
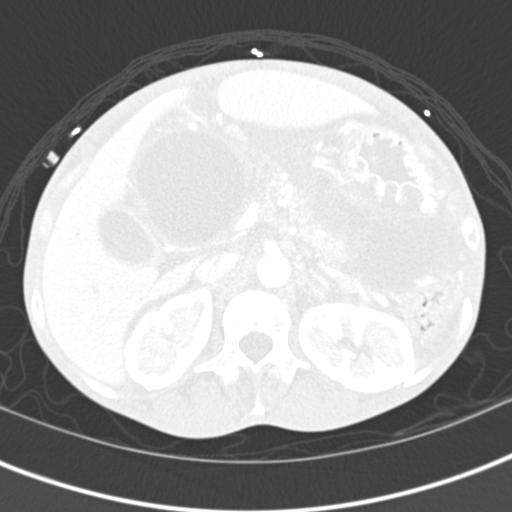
[im 215/258  soft-tissue]
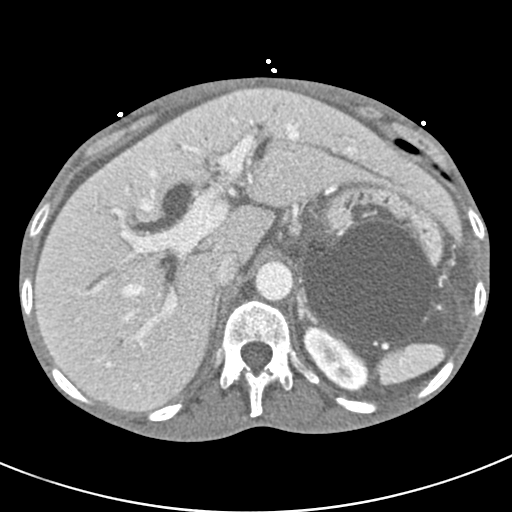
[im 215/258  lung]
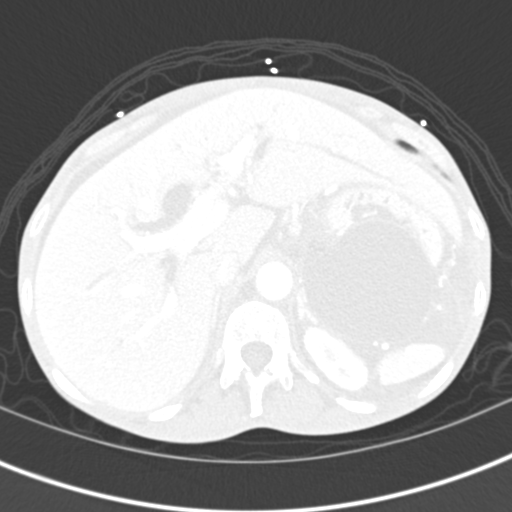
[im 236/258  soft-tissue]
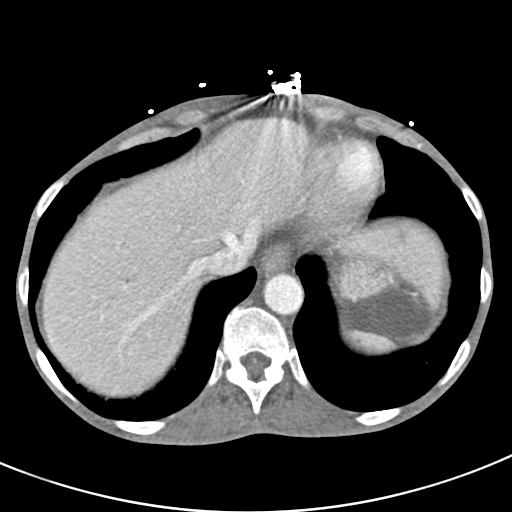
[im 236/258  lung]
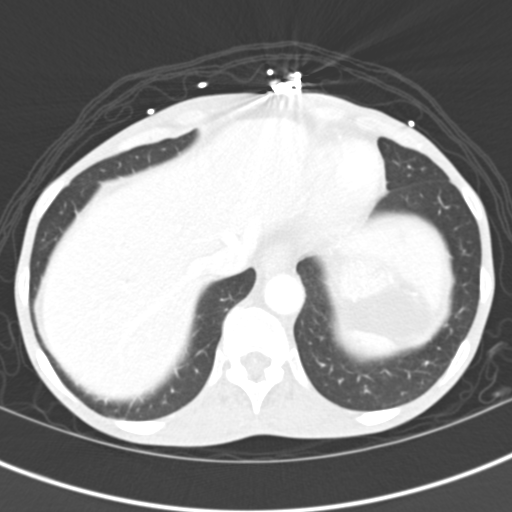
[im 236/258  bone]
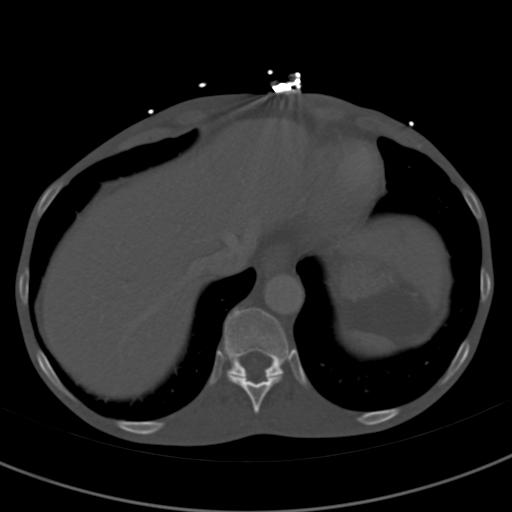

[Series 9: cor pancreas arterial · coronal · arterial · 0.55mm/px · 3 of 110 slices shown]
[im 28/110  soft-tissue]
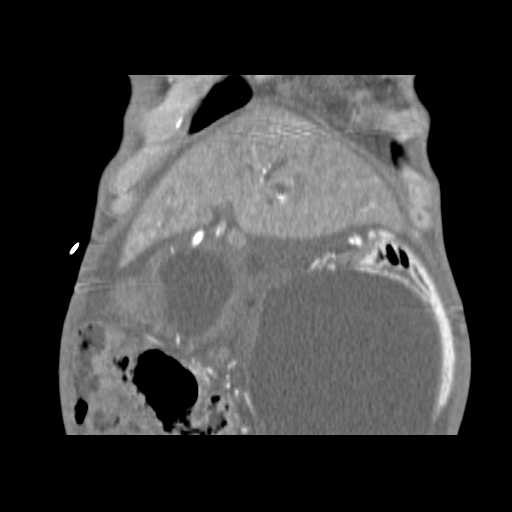
[im 55/110  soft-tissue]
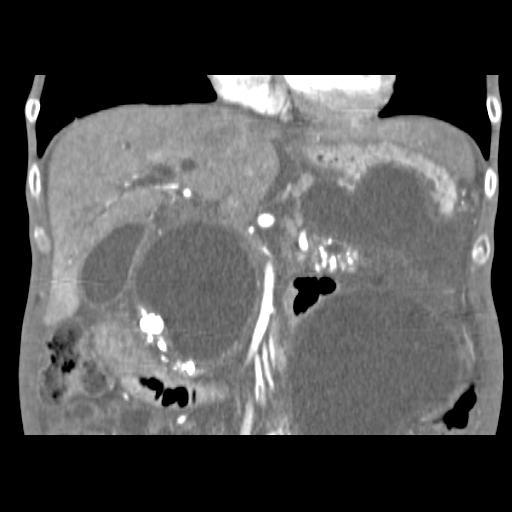
[im 82/110  soft-tissue]
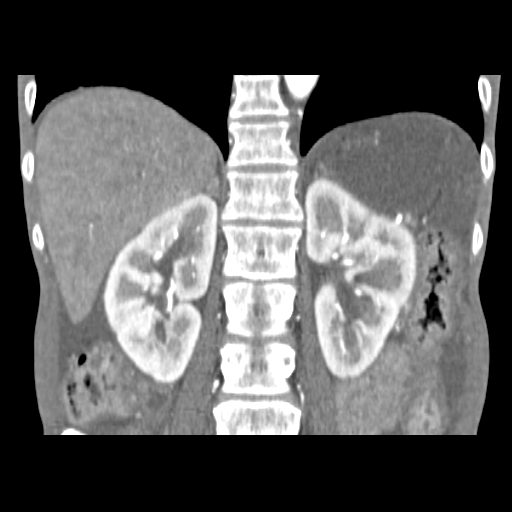

[13 of 46 positions shown; findings below may reference images not displayed]

FINDINGS: BODY WALL: Unremarkable.

LOWER CHEST: Bilateral nipple shadows noted on the scanogram.

ABDOMEN/PELVIS:

Liver: 10 mm hypervascular focus in segment 4B is stable from 1564.
Based on delayed imaging favor perfusion anomaly/shunt rather than
small hemangioma.

Biliary: There is progressive intra and extrahepatic biliary
dilatation secondary to mass effect on the distal common bile duct
by a large pancreatic pseudocyst. No calcified stone.

Pancreas: Chronic pancreatitis with diffuse parenchymal
calcifications and chronic ductal enlargement. There are
new/enlarged pseudocysts including an 8 cm pseudocyst within the
pancreatic head which obstructs the distal common bile duct and
narrows the portal vein and upper SMV. There is a 10 cm lobulated
pseudocyst posterior to the proximal stomach causing submucosal
edema and distortion of the stomach. There is a separate more
inferior posterior gastric pseudocyst which measures 13 cm that
flattens the gastric body and antrum. There are other smaller
pseudocysts ventral to the pancreatic body. No internal gas or wall
thickening suggestive of a superinfection. No vascular occlusion or
pseudoaneurysm detected. No delayed or arterially enhancing
pancreatic lesion is identified, although extensive distortion could
easily obscure a carcinoma.

Spleen: Unremarkable.

Adrenals: Unremarkable.

Kidneys and ureters: No hydronephrosis or stone.

Bladder: Unremarkable.

Reproductive: Unremarkable.

Bowel: There is circumferential thickening of the distal colon. No
vascular occlusion to explain this finding. No small bowel
obstruction.

Retroperitoneum: Retroperitoneal edema diffusely.

Peritoneum: Small volume ascites.

Vascular: No acute abnormality.

OSSEOUS: No acute abnormalities.
IMPRESSION: 1. Chronic pancreatitis with pseudocysts that have formed/enlarged
since 1564. An 8 cm pancreatic head pseudocyst obstructs the common
bile duct and 2 left upper quadrant pseudocysts (up to 13 cm) efface
the stomach.
2. Distal colitis.

## 2017-07-17 ENCOUNTER — Emergency Department
Admission: EM | Admit: 2017-07-17 | Discharge: 2017-07-17 | Disposition: A | Discharge status: Home / Self Care | Payer: Medicaid Other | Attending: Emergency Medicine | Admitting: Emergency Medicine

## 2017-07-17 ENCOUNTER — Other Ambulatory Visit: Payer: Self-pay

## 2017-07-17 ENCOUNTER — Encounter: Payer: Self-pay | Admitting: Emergency Medicine

## 2017-07-17 DIAGNOSIS — H539 Unspecified visual disturbance: Secondary | ICD-10-CM | POA: Diagnosis present

## 2017-07-17 DIAGNOSIS — I10 Essential (primary) hypertension: Secondary | ICD-10-CM | POA: Diagnosis not present

## 2017-07-17 HISTORY — DX: Essential (primary) hypertension: I10

## 2017-07-17 LAB — CBC
HEMATOCRIT: 39.7 % (ref 35.0–47.0)
Hemoglobin: 13.2 g/dL (ref 12.0–16.0)
MCH: 31 pg (ref 26.0–34.0)
MCHC: 33.3 g/dL (ref 32.0–36.0)
MCV: 93.1 fL (ref 80.0–100.0)
PLATELETS: 409 10*3/uL (ref 150–440)
RBC: 4.26 MIL/uL (ref 3.80–5.20)
RDW: 15.2 % — ABNORMAL HIGH (ref 11.5–14.5)
WBC: 8.5 10*3/uL (ref 3.6–11.0)

## 2017-07-17 LAB — BASIC METABOLIC PANEL
Anion gap: 10 (ref 5–15)
BUN: 8 mg/dL (ref 6–20)
CHLORIDE: 108 mmol/L (ref 101–111)
CO2: 21 mmol/L — AB (ref 22–32)
CREATININE: 0.63 mg/dL (ref 0.44–1.00)
Calcium: 8.1 mg/dL — ABNORMAL LOW (ref 8.9–10.3)
GFR calc non Af Amer: >60 mL/min (ref >60)
Glucose, Bld: 118 mg/dL — ABNORMAL HIGH (ref 65–99)
Potassium: 3 mmol/L — ABNORMAL LOW (ref 3.5–5.1)
Sodium: 139 mmol/L (ref 135–145)

## 2017-07-17 LAB — TROPONIN I

## 2017-07-17 LAB — URINALYSIS, COMPLETE (UACMP) WITH MICROSCOPIC
BACTERIA UA: NONE SEEN
Bilirubin Urine: NEGATIVE
Glucose, UA: NEGATIVE mg/dL
Hgb urine dipstick: NEGATIVE
KETONES UR: NEGATIVE mg/dL
Leukocytes, UA: NEGATIVE
Nitrite: NEGATIVE
PH: 5 (ref 5.0–8.0)
Protein, ur: NEGATIVE mg/dL
RBC / HPF: NONE SEEN RBC/hpf (ref 0–5)
Specific Gravity, Urine: 1.019 (ref 1.005–1.030)

## 2017-07-17 LAB — LIPASE, BLOOD: LIPASE: 15 U/L (ref 11–51)

## 2017-07-17 MED ORDER — CLONIDINE HCL 0.1 MG PO TABS: 0.1000 mg | ORAL_TABLET | Freq: Every day | ORAL | 11 refills | Status: AC

## 2017-07-17 MED ORDER — POTASSIUM CHLORIDE ER 10 MEQ PO TBCR: 10.0000 meq | EXTENDED_RELEASE_TABLET | Freq: Every day | ORAL | 0 refills | Status: AC

## 2017-07-17 MED ORDER — TETRACAINE HCL 0.5 % OP SOLN
OPHTHALMIC | Status: AC
  Filled 2017-07-17: qty 4

## 2017-07-17 MED ORDER — POTASSIUM CHLORIDE CRYS ER 20 MEQ PO TBCR
40.0000 meq | EXTENDED_RELEASE_TABLET | Freq: Once | ORAL | Status: AC
  Administered 2017-07-17: 40 meq via ORAL
  Filled 2017-07-17: qty 2

## 2017-07-17 MED ORDER — LISINOPRIL 10 MG PO TABS: 10.0000 mg | ORAL_TABLET | Freq: Every day | ORAL | 0 refills | Status: DC

## 2017-07-17 NOTE — ED Notes (Signed)
Visual acuity:  L eye 20/100  R Eye 20/100   Pt placed in eye room in ED to await eval from Dr. Derrill KayGoodman.

## 2017-07-17 NOTE — ED Notes (Signed)
Pt reports she is having visual changes - she is seeing "spots, and balls bursting, like looking through fog" - c/o upper abd pain since 2015 after having pancreatic surgery

## 2017-07-17 NOTE — Discharge Instructions (Signed)
Please go to [facility] today or tomorrow morning. Please seek medical attention for any high fevers, chest pain, shortness of breath, change in behavior, persistent vomiting, bloody stool or any other new or concerning symptoms.

## 2017-07-17 NOTE — ED Triage Notes (Signed)
Pt reports seeing spots and blurred vision for two weeks. Pt reports has been out of her antihypertensive medications for two weeks due to finances. 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 Ambulatory to triage. No apparent distress noted. Pt hypertensive in triage.

## 2017-07-17 NOTE — ED Provider Notes (Signed)
[facility] Emergency Department Provider Note   ____________________________________________   I have reviewed the triage vital signs and the nursing notes.   HISTORY  Chief Complaint Visual change  History limited by: Not Limited   HPI Megan Mayer is a 52 y.o. female who presents to the emergency department today with primary concern for decreased eye sight.    LOCATION:bilateral eyes  DURATION:2 weeks TIMING: constant SEVERITY: worsening QUALITY: "foggy" CONTEXT: patient states that she has been out of her hypertensive medications for the past two weeks. First it was her left eye that was affected but now it is bilateral eyes.  MODIFYING FACTORS: worse in the morning ASSOCIATED SYMPTOMS: has had some pain in her eyes  Per medical record review patient has a history of htn.   Past Medical History:  Diagnosis Date  . Hypertension     There are no active problems to display for this patient.   No past surgical history on file.  Prior to Admission medications   Not on File    Allergies Patient has no known allergies.  Family History  Problem Relation Age of Onset  . Breast cancer Neg Hx     Social History Social History   Tobacco Use  . Smoking status: Not on file  Substance Use Topics  . Alcohol use: Not on file  . Drug use: Not on file    Review of Systems Constitutional: No fever/chills Eyes: Positive for visual change. ENT: No sore throat. Cardiovascular: Denies chest pain. Respiratory: Denies shortness of breath. Gastrointestinal: Positive for upper abdominal pain, nausea and vomiting.  Genitourinary: Negative for dysuria. Musculoskeletal: Negative for back pain. Skin: Negative for rash. Neurological: Negative for headaches, focal weakness or numbness.  ____________________________________________   PHYSICAL EXAM:  VITAL SIGNS: ED Triage Vitals [07/17/17 1244]  Enc Vitals Group     BP (!) 195/123      Pulse Rate 64     Resp 18     Temp 98.3 F (36.8 C)     Temp Source Oral     SpO2 100 %     Weight 84 lb (38.1 kg)     Height 5\' 2"  (1.575 m)     Head Circumference      Peak Flow      Pain Score 8   Constitutional: Alert and oriented. Well appearing and in no distress. Eyes: Conjunctivae are normal. Lack of red relfex bilaterally. Right eye pressure 15. Left eye pressure 10.  ENT   Head: Normocephalic and atraumatic.   Nose: No congestion/rhinnorhea.   Mouth/Throat: Mucous membranes are moist.   Neck: No stridor. Hematological/Lymphatic/Immunilogical: No cervical lymphadenopathy. Cardiovascular: Normal rate, regular rhythm.  No murmurs, rubs, or gallops. Respiratory: Normal respiratory effort without tachypnea nor retractions. Breath sounds are clear and equal bilaterally. No wheezes/rales/rhonchi. Gastrointestinal: Soft and non tender. No rebound. No guarding.  Genitourinary: Deferred Musculoskeletal: Normal range of motion in all extremities. No lower extremity edema. Neurologic:  Normal speech and language. No gross focal neurologic deficits are appreciated.  Skin:  Skin is warm, dry and intact. No rash noted. Psychiatric: Mood and affect are normal. Speech and behavior are normal. Patient exhibits appropriate insight and judgment.  ____________________________________________    LABS (pertinent positives/negatives)  Trop <0.03 Lipase 15 CBC wnl except RDW 15.2 BMP k 3.0, glu 118  ____________________________________________   EKG  I, Phineas Semen, attending physician, personally viewed and interpreted this EKG  EKG Time: 1248 Rate: 61 Rhythm: normal sinus  rhythm Axis: normal Intervals: qtc 426 QRS: narrow, q waves V1 ST changes: no st elevation Impression: abnormal ekg   ____________________________________________     RADIOLOGY  None  ____________________________________________   PROCEDURES  Procedures  ____________________________________________   INITIAL IMPRESSION / ASSESSMENT AND PLAN / ED COURSE  Pertinent labs & imaging results that were available during my care of the patient were reviewed by me and considered in my medical decision making (see chart for details).  Patient presented to the emergency department today because of concerns for vision change and high blood pressure.  Patient was quite hypertensive here in the emergency department.  She has been off her medications for 2 weeks.  Patient is unsure of which medication what doses she is on.  Per care everywhere does.  She is on lisinopril and clonidine.  Will start patient back on low dose.  In terms of the patient's visual changes visual acuity was 20/100 bilaterally.  She did not have red reflex and I cannot get a good funduscopic exam.  Discussed with Dr. Druscilla BrowniePorfilio with [facility].  Did asked that she be sent over either this afternoon or tomorrow morning.  Discussed with patient the plan.  ____________________________________________   FINAL CLINICAL IMPRESSION(S) / ED DIAGNOSES  Final diagnoses:  Vision changes  Hypertension, unspecified type     Note: This dictation was prepared with Dragon dictation. Any transcriptional errors that result from this process are unintentional     Phineas SemenGoodman, Merline Perkin, MD 07/17/17 1540

## 2017-08-02 ENCOUNTER — Other Ambulatory Visit
Admission: RE | Admit: 2017-08-02 | Discharge: 2017-08-02 | Disposition: A | Discharge status: Home / Self Care | Payer: Medicaid Other | Source: Ambulatory Visit | Attending: Ophthalmology | Admitting: Ophthalmology

## 2017-08-02 ENCOUNTER — Ambulatory Visit
Admission: RE | Admit: 2017-08-02 | Discharge: 2017-08-02 | Disposition: A | Discharge status: Home / Self Care | Payer: Medicaid Other | Source: Ambulatory Visit | Attending: Ophthalmology | Admitting: Ophthalmology

## 2017-08-02 ENCOUNTER — Other Ambulatory Visit: Payer: Self-pay | Admitting: Ophthalmology

## 2017-08-02 DIAGNOSIS — H209 Unspecified iridocyclitis: Secondary | ICD-10-CM

## 2017-08-02 DIAGNOSIS — H3093 Unspecified chorioretinal inflammation, bilateral: Secondary | ICD-10-CM | POA: Diagnosis not present

## 2017-08-02 LAB — CBC WITH DIFFERENTIAL/PLATELET
BASOS ABS: 0.1 10*3/uL (ref 0–0.1)
Basophils Relative: 2 %
EOS PCT: 1 %
Eosinophils Absolute: 0.1 10*3/uL (ref 0–0.7)
HEMATOCRIT: 38 % (ref 35.0–47.0)
Hemoglobin: 12.6 g/dL (ref 12.0–16.0)
LYMPHS PCT: 25 %
Lymphs Abs: 1.6 10*3/uL (ref 1.0–3.6)
MCH: 31.6 pg (ref 26.0–34.0)
MCHC: 33.3 g/dL (ref 32.0–36.0)
MCV: 94.7 fL (ref 80.0–100.0)
MONO ABS: 0.3 10*3/uL (ref 0.2–0.9)
Monocytes Relative: 5 %
Neutro Abs: 4.3 10*3/uL (ref 1.4–6.5)
Neutrophils Relative %: 67 %
PLATELETS: 433 10*3/uL (ref 150–440)
RBC: 4.01 MIL/uL (ref 3.80–5.20)
RDW: 17.8 % — ABNORMAL HIGH (ref 11.5–14.5)
WBC: 6.4 10*3/uL (ref 3.6–11.0)

## 2017-08-02 LAB — SEDIMENTATION RATE: Sed Rate: 41 mm/hr — ABNORMAL HIGH (ref 0–30)

## 2017-08-03 LAB — RPR: RPR: NONREACTIVE

## 2017-08-03 LAB — ANGIOTENSIN CONVERTING ENZYME: Angiotensin-Converting Enzyme: 19 U/L (ref 14–82)

## 2017-08-05 LAB — ANTINUCLEAR ANTIBODIES, IFA: ANTINUCLEAR ANTIBODIES, IFA: NEGATIVE

## 2017-08-05 LAB — ANCA TITERS: C-ANCA: <1:20 {titer}

## 2017-08-06 LAB — QUANTIFERON-TB GOLD PLUS (RQFGPL)
QUANTIFERON NIL VALUE: 0.07 [IU]/mL
QUANTIFERON TB1 AG VALUE: 0.05 [IU]/mL
QUANTIFERON TB2 AG VALUE: 0.05 [IU]/mL
QuantiFERON Mitogen Value: >10 IU/mL

## 2017-08-06 LAB — QUANTIFERON-TB GOLD PLUS: QuantiFERON-TB Gold Plus: NEGATIVE

## 2017-09-25 ENCOUNTER — Ambulatory Visit
Admission: RE | Admit: 2017-09-25 | Discharge: 2017-09-25 | Disposition: A | Discharge status: Home / Self Care | Payer: Self-pay | Source: Ambulatory Visit | Attending: Oncology | Admitting: Oncology

## 2017-09-25 ENCOUNTER — Encounter (INDEPENDENT_AMBULATORY_CARE_PROVIDER_SITE_OTHER): Payer: Self-pay

## 2017-09-25 ENCOUNTER — Ambulatory Visit: Payer: Self-pay | Attending: Oncology

## 2017-09-25 VITALS — BP 167/110 | HR 64 | Temp 98.3°F | Ht 62.0 in | Wt 92.0 lb

## 2017-09-25 DIAGNOSIS — Z0000 Encounter for general adult medical examination without abnormal findings: Secondary | ICD-10-CM

## 2017-09-25 NOTE — Progress Notes (Signed)
Subjective:     Patient ID: Alphonzo Grieveeborah A Stiverson, female   DOB: 25-Apr-1966, 52 y.o.   MRN: [id number]  HPI   Review of Systems     Objective:   Physical Exam  Pulmonary/Chest: Right breast exhibits no inverted nipple, no mass, no nipple discharge, no skin change and no tenderness. Left breast exhibits no inverted nipple, no mass, no nipple discharge, no skin change and no tenderness. Breasts are symmetrical.       Assessment:     52 year old female returns for BCCCP screening.  Patient screened, and meets BCCCP eligibility.  Patient does not have insurance, Medicare or Medicaid.  Handout given on Affordable Care Act.Instructed patient on breast self awareness using teach back method.  CBE unremarkable.  No mass or lump palpated.  [facility] and has been treated for pancreatitis.  She has gained 8 pounds since last B[facility]CP visit.    Plan:     Sent for bilateral screening mammogram.

## 2017-09-26 NOTE — Progress Notes (Signed)
Letter mailed from [facility] to notify of normal mammogram results.  Patient to return in one year for annual screening.  Copy to [facility]. 

## 2018-10-01 ENCOUNTER — Other Ambulatory Visit
Admission: RE | Admit: 2018-10-01 | Discharge: 2018-10-01 | Disposition: A | Discharge status: Home / Self Care | Payer: Medicare Other | Source: Ambulatory Visit | Attending: Ophthalmology | Admitting: Ophthalmology

## 2018-10-01 DIAGNOSIS — H209 Unspecified iridocyclitis: Secondary | ICD-10-CM | POA: Diagnosis present

## 2018-10-06 LAB — HLA-B27 ANTIGEN: HLA-B27: NEGATIVE

## 2019-01-30 ENCOUNTER — Other Ambulatory Visit: Payer: Self-pay | Admitting: Family Medicine

## 2019-01-30 DIAGNOSIS — Z1231 Encounter for screening mammogram for malignant neoplasm of breast: Secondary | ICD-10-CM

## 2019-06-19 ENCOUNTER — Other Ambulatory Visit
Admission: RE | Admit: 2019-06-19 | Discharge: 2019-06-19 | Disposition: A | Discharge status: Home / Self Care | Payer: Medicare Other | Source: Ambulatory Visit | Attending: Ophthalmology | Admitting: Ophthalmology

## 2019-06-19 DIAGNOSIS — Z20828 Contact with and (suspected) exposure to other viral communicable diseases: Secondary | ICD-10-CM | POA: Diagnosis not present

## 2019-06-19 DIAGNOSIS — Z01812 Encounter for preprocedural laboratory examination: Secondary | ICD-10-CM | POA: Diagnosis present

## 2019-06-20 LAB — SARS CORONAVIRUS 2 (TAT 6-24 HRS): SARS Coronavirus 2: NEGATIVE

## 2019-06-21 NOTE — Anesthesia Preprocedure Evaluation (Addendum)
Anesthesia Evaluation  Patient identified by MRN, date of birth, ID band Patient awake    Reviewed: Allergy & Precautions, NPO status , Patient's Chart, lab work & pertinent test results  History of Anesthesia Complications Negative for: history of anesthetic complications  Airway Mallampati: III   Neck ROM: Full    Dental  (+)    Pulmonary Current Smoker (1/2 ppd) and Patient abstained from smoking.,    Pulmonary exam normal breath sounds clear to auscultation       Cardiovascular hypertension, Normal cardiovascular exam Rhythm:Regular Rate:Normal     Neuro/Psych negative neurological ROS     GI/Hepatic negative GI ROS,   Endo/Other  negative endocrine ROS  Renal/GU negative Renal ROS     Musculoskeletal   Abdominal   Peds  Hematology negative hematology ROS (+)   Anesthesia Other Findings   Reproductive/Obstetrics                            Anesthesia Physical Anesthesia Plan  ASA: II  Anesthesia Plan: MAC   Post-op Pain Management:    Induction: Intravenous  PONV Risk Score and Plan: 1 and Midazolam, TIVA and Treatment may vary due to age or medical condition  Airway Management Planned: Natural Airway  Additional Equipment:   Intra-op Plan:   Post-operative Plan:   Informed Consent: I have reviewed the patients History and Physical, chart, labs and discussed the procedure including the risks, benefits and alternatives for the proposed anesthesia with the patient or authorized representative who has indicated his/her understanding and acceptance.       Plan Discussed with: CRNA  Anesthesia Plan Comments:        Anesthesia Quick Evaluation

## 2019-06-23 NOTE — Discharge Instructions (Signed)

## 2019-06-24 ENCOUNTER — Other Ambulatory Visit: Payer: Self-pay

## 2019-06-24 ENCOUNTER — Encounter
Admission: RE | Disposition: A | Discharge status: Home / Self Care | Payer: Self-pay | Source: Home / Self Care | Attending: Ophthalmology

## 2019-06-24 ENCOUNTER — Ambulatory Visit: Payer: Medicare Other | Admitting: Anesthesiology

## 2019-06-24 ENCOUNTER — Encounter: Payer: Self-pay | Admitting: Ophthalmology

## 2019-06-24 ENCOUNTER — Ambulatory Visit
Admission: RE | Admit: 2019-06-24 | Discharge: 2019-06-24 | Disposition: A | Discharge status: Home / Self Care | Payer: Medicare Other | Attending: Ophthalmology | Admitting: Ophthalmology

## 2019-06-24 DIAGNOSIS — F17200 Nicotine dependence, unspecified, uncomplicated: Secondary | ICD-10-CM | POA: Diagnosis not present

## 2019-06-24 DIAGNOSIS — I10 Essential (primary) hypertension: Secondary | ICD-10-CM | POA: Insufficient documentation

## 2019-06-24 DIAGNOSIS — H2512 Age-related nuclear cataract, left eye: Secondary | ICD-10-CM | POA: Insufficient documentation

## 2019-06-24 HISTORY — PX: CATARACT EXTRACTION W/PHACO: SHX586

## 2019-06-24 SURGERY — PHACOEMULSIFICATION, CATARACT, WITH IOL INSERTION
Anesthesia: Monitor Anesthesia Care | Site: Eye | Laterality: Left

## 2019-06-24 MED ORDER — FENTANYL CITRATE (PF) 100 MCG/2ML IJ SOLN
INTRAMUSCULAR | Status: DC | PRN
  Administered 2019-06-24: 50 ug via INTRAVENOUS

## 2019-06-24 MED ORDER — EPINEPHRINE PF 1 MG/ML IJ SOLN
INTRAOCULAR | Status: DC | PRN
  Administered 2019-06-24: 33 mL via OPHTHALMIC

## 2019-06-24 MED ORDER — BRIMONIDINE TARTRATE-TIMOLOL 0.2-0.5 % OP SOLN
OPHTHALMIC | Status: DC | PRN
  Administered 2019-06-24: 1 [drp] via OPHTHALMIC

## 2019-06-24 MED ORDER — CEFUROXIME OPHTHALMIC INJECTION 1 MG/0.1 ML
INJECTION | OPHTHALMIC | Status: DC | PRN
  Administered 2019-06-24: 0.1 mL via INTRACAMERAL

## 2019-06-24 MED ORDER — NA HYALUR & NA CHOND-NA HYALUR 0.4-0.35 ML IO KIT
PACK | INTRAOCULAR | Status: DC | PRN
  Administered 2019-06-24: 1 mL via INTRAOCULAR

## 2019-06-24 MED ORDER — SODIUM HYALURONATE 23 MG/ML IO SOLN
INTRAOCULAR | Status: DC | PRN
  Administered 2019-06-24: 0.6 mL via INTRAOCULAR

## 2019-06-24 MED ORDER — LIDOCAINE HCL (PF) 2 % IJ SOLN
INTRAOCULAR | Status: DC | PRN
  Administered 2019-06-24: 1 mL

## 2019-06-24 MED ORDER — MIDAZOLAM HCL 2 MG/2ML IJ SOLN
INTRAMUSCULAR | Status: DC | PRN
  Administered 2019-06-24: 1 mg via INTRAVENOUS

## 2019-06-24 MED ORDER — ONDANSETRON HCL 4 MG/2ML IJ SOLN: 4.0000 mg | Freq: Once | INTRAMUSCULAR | Status: DC | PRN

## 2019-06-24 MED ORDER — ACETAMINOPHEN 160 MG/5ML PO SOLN: 325.0000 mg | ORAL | Status: DC | PRN

## 2019-06-24 MED ORDER — TETRACAINE HCL 0.5 % OP SOLN
1.0000 [drp] | OPHTHALMIC | Status: DC | PRN
  Administered 2019-06-24 (×2): 1 [drp] via OPHTHALMIC

## 2019-06-24 MED ORDER — MOXIFLOXACIN HCL 0.5 % OP SOLN
1.0000 [drp] | OPHTHALMIC | Status: DC | PRN
  Administered 2019-06-24 (×3): 1 [drp] via OPHTHALMIC

## 2019-06-24 MED ORDER — ARMC OPHTHALMIC DILATING DROPS
1.0000 "application " | OPHTHALMIC | Status: DC | PRN
  Administered 2019-06-24 (×3): 1 via OPHTHALMIC

## 2019-06-24 MED ORDER — ACETAMINOPHEN 325 MG PO TABS: 650.0000 mg | ORAL_TABLET | Freq: Once | ORAL | Status: DC | PRN

## 2019-06-24 MED ORDER — TRYPAN BLUE 0.06 % OP SOLN
OPHTHALMIC | Status: DC | PRN
  Administered 2019-06-24: 0.5 mL via INTRAOCULAR

## 2019-06-24 SURGICAL SUPPLY — 16 items
CANNULA ANT/CHMB 27G (MISCELLANEOUS) ×1 IMPLANT
CANNULA ANT/CHMB 27GA (MISCELLANEOUS) ×3 IMPLANT
GLOVE SURG LX 7.5 STRW (GLOVE) ×2
GLOVE SURG LX STRL 7.5 STRW (GLOVE) ×1 IMPLANT
GLOVE SURG TRIUMPH 8.0 PF LTX (GLOVE) ×3 IMPLANT
GOWN STRL REUS W/ TWL LRG LVL3 (GOWN DISPOSABLE) ×2 IMPLANT
GOWN STRL REUS W/TWL LRG LVL3 (GOWN DISPOSABLE) ×4
LENS IOL TECNIS ITEC 20.5 (Intraocular Lens) ×2 IMPLANT
MARKER SKIN DUAL TIP RULER LAB (MISCELLANEOUS) ×3 IMPLANT
PACK CATARACT BRASINGTON (MISCELLANEOUS) ×3 IMPLANT
PACK EYE AFTER SURG (MISCELLANEOUS) ×3 IMPLANT
PACK OPTHALMIC (MISCELLANEOUS) ×3 IMPLANT
SYR 3ML LL SCALE MARK (SYRINGE) ×3 IMPLANT
SYR TB 1ML LUER SLIP (SYRINGE) ×3 IMPLANT
WATER STERILE IRR 500ML POUR (IV SOLUTION) ×3 IMPLANT
WIPE NON LINTING 3.25X3.25 (MISCELLANEOUS) ×3 IMPLANT

## 2019-06-24 NOTE — Anesthesia Procedure Notes (Signed)
Procedure Name: MAC Performed by: Kimo Bancroft, CRNA Pre-anesthesia Checklist: Patient identified, Emergency Drugs available, Suction available, Timeout performed and Patient being monitored Patient Re-evaluated:Patient Re-evaluated prior to induction Oxygen Delivery Method: Nasal cannula Placement Confirmation: positive ETCO2       

## 2019-06-24 NOTE — Op Note (Signed)
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     PROCEDURE:  Phacoemulsification with posterior chamber intraocular lens implantation of the left eye which required pupil stretching with the Malyugin pupil expansion device and vision blue dye  Ultrasound time: Procedure(s): CATARACT EXTRACTION PHACO AND INTRAOCULAR LENS PLACEMENT (IOC) LEFT MALYUGIN  0.59  00:11.9  4.9% (Left)  LENS:   Implant Name Type Inv. Item Serial No. Manufacturer Lot No. LRB No. Used Action  LENS IOL DIOP 20.5 - [id number] Intraocular Lens LENS IOL DIOP 20.5 [id number] AMO  Left 1 Implanted       SURGEON:  Wyonia Hough, MD   ANESTHESIA: Topical with tetracaine drops and 2% Xylocaine jelly, augmented with 1% preservative-free intracameral lidocaine.   COMPLICATIONS:  None.   DESCRIPTION OF PROCEDURE:  The patient was identified in the holding room and transported to the operating room and placed in the supine position under the operating microscope.  The left eye was identified as the operative eye and it was prepped and draped in the usual sterile ophthalmic fashion.   A 1 millimeter clear-corneal paracentesis was made at the 1:30 position.  0.5 ml of preservative-free 1% lidocaine was injected into the anterior chamber.   Healon 5 was placed in the anterior chamber followed by vision Blue dye. The anterior chamber was fully filled with Healon 5 viscoelastic. A 2.4 millimeter keratome was used to make a near-clear corneal incision at the 10:30 position.  The anterior chamber was rinsed with balanced salt to remove the dye and viscoelastic.  Additional Healon 5 was placed.  A Malyugin pupil expander was then placed through the main incision and into the anterior chamber of the eye.  The edge of the iris was  secured on the lip of the pupil expander and it was released, thereby expanding the pupil to approximately 7 millimeters for completion of the cataract surgery.  Additional Viscoat was placed in the anterior chamber.  A cystotome and capsulorrhexis forceps were used to make a curvilinear capsulorrhexis.   Balanced salt solution was used to hydrodissect and hydrodelineate the lens nucleus.   Phacoemulsification was used in stop and chop fashion to remove the lens, nucleus and epinucleus.  The remaining cortex was aspirated using the irrigation aspiration handpiece.  Additional Provisc was placed into the eye to distend the capsular bag for lens placement.  A lens was then injected into the capsular bag.  The pupil expanding ring was removed using a Kuglen hook and insertion device. The remaining viscoelastic was aspirated from the capsular bag and the anterior chamber.  The anterior chamber was filled with balanced salt solution to inflate to a physiologic pressure.   Wounds were hydrated with balanced salt solution.  The anterior chamber was inflated to a physiologic pressure with balanced salt solution.  No wound leaks were noted. Cefuroxime 0.1 ml of a 10mg /ml solution was injected into the anterior chamber for a dose of 1 mg of intracameral antibiotic at the completion of the case.   Timolol and Brimonidine drops were applied to the eye.  The patient was taken to the recovery room in stable condition without complications of anesthesia or surgery.  Normand Damron 06/24/2019, 10:29 AM

## 2019-06-24 NOTE — Anesthesia Postprocedure Evaluation (Signed)
Anesthesia Post Note  Patient: Megan Mayer  Procedure(s) Performed: CATARACT EXTRACTION PHACO AND INTRAOCULAR LENS PLACEMENT (IOC) LEFT MALYUGIN  0.59  00:11.9  4.9% (Left Eye)     Patient location during evaluation: PACU Anesthesia Type: MAC Level of consciousness: awake and alert, oriented and patient cooperative Pain management: pain level controlled Vital Signs Assessment: post-procedure vital signs reviewed and stable Respiratory status: spontaneous breathing, nonlabored ventilation and respiratory function stable Cardiovascular status: blood pressure returned to baseline and stable Postop Assessment: adequate PO intake Anesthetic complications: no    Darrin Nipper

## 2019-06-24 NOTE — H&P (Signed)

## 2019-06-24 NOTE — Transfer of Care (Signed)
Immediate Anesthesia Transfer of Care Note  Patient: Megan Mayer  Procedure(s) Performed: CATARACT EXTRACTION PHACO AND INTRAOCULAR LENS PLACEMENT (IOC) LEFT MALYUGIN  0.59  00:11.9  4.9% (Left Eye)  Patient Location: PACU  Anesthesia Type: MAC  Level of Consciousness: awake, alert  and patient cooperative  Airway and Oxygen Therapy: Patient Spontanous Breathing and Patient connected to supplemental oxygen  Post-op Assessment: Post-op Vital signs reviewed, Patient's Cardiovascular Status Stable, Respiratory Function Stable, Patent Airway and No signs of Nausea or vomiting  Post-op Vital Signs: Reviewed and stable  Complications: No apparent anesthesia complications

## 2019-06-25 ENCOUNTER — Encounter: Payer: Self-pay | Admitting: *Deleted

## 2019-07-06 ENCOUNTER — Encounter: Payer: Self-pay | Admitting: Ophthalmology

## 2019-07-06 ENCOUNTER — Other Ambulatory Visit: Payer: Self-pay

## 2019-07-13 ENCOUNTER — Other Ambulatory Visit: Payer: Self-pay

## 2019-07-13 ENCOUNTER — Other Ambulatory Visit
Admission: RE | Admit: 2019-07-13 | Discharge: 2019-07-13 | Disposition: A | Discharge status: Home / Self Care | Payer: Medicare Other | Source: Ambulatory Visit | Attending: Ophthalmology | Admitting: Ophthalmology

## 2019-07-13 DIAGNOSIS — Z01812 Encounter for preprocedural laboratory examination: Secondary | ICD-10-CM | POA: Insufficient documentation

## 2019-07-13 DIAGNOSIS — Z20822 Contact with and (suspected) exposure to covid-19: Secondary | ICD-10-CM | POA: Insufficient documentation

## 2019-07-13 LAB — SARS CORONAVIRUS 2 (TAT 6-24 HRS): SARS Coronavirus 2: NEGATIVE

## 2019-07-13 NOTE — Discharge Instructions (Signed)

## 2019-07-15 ENCOUNTER — Encounter: Payer: Self-pay | Admitting: Ophthalmology

## 2019-07-15 ENCOUNTER — Ambulatory Visit: Payer: Medicare Other | Admitting: Anesthesiology

## 2019-07-15 ENCOUNTER — Ambulatory Visit
Admission: RE | Admit: 2019-07-15 | Discharge: 2019-07-15 | Disposition: A | Discharge status: Home / Self Care | Payer: Medicare Other | Attending: Ophthalmology | Admitting: Ophthalmology

## 2019-07-15 ENCOUNTER — Encounter
Admission: RE | Disposition: A | Discharge status: Home / Self Care | Payer: Self-pay | Source: Home / Self Care | Attending: Ophthalmology

## 2019-07-15 ENCOUNTER — Other Ambulatory Visit: Payer: Self-pay

## 2019-07-15 DIAGNOSIS — H2511 Age-related nuclear cataract, right eye: Secondary | ICD-10-CM | POA: Diagnosis present

## 2019-07-15 DIAGNOSIS — Z79899 Other long term (current) drug therapy: Secondary | ICD-10-CM | POA: Diagnosis not present

## 2019-07-15 DIAGNOSIS — F17200 Nicotine dependence, unspecified, uncomplicated: Secondary | ICD-10-CM | POA: Insufficient documentation

## 2019-07-15 DIAGNOSIS — H5703 Miosis: Secondary | ICD-10-CM | POA: Diagnosis not present

## 2019-07-15 DIAGNOSIS — I10 Essential (primary) hypertension: Secondary | ICD-10-CM | POA: Insufficient documentation

## 2019-07-15 HISTORY — PX: CATARACT EXTRACTION W/PHACO: SHX586

## 2019-07-15 SURGERY — PHACOEMULSIFICATION, CATARACT, WITH IOL INSERTION
Anesthesia: Monitor Anesthesia Care | Site: Eye | Laterality: Right

## 2019-07-15 MED ORDER — ONDANSETRON HCL 4 MG/2ML IJ SOLN: 4.0000 mg | Freq: Once | INTRAMUSCULAR | Status: DC | PRN

## 2019-07-15 MED ORDER — TRYPAN BLUE 0.06 % OP SOLN
OPHTHALMIC | Status: DC | PRN
  Administered 2019-07-15: 0.5 mL via INTRAOCULAR

## 2019-07-15 MED ORDER — ACETAMINOPHEN 160 MG/5ML PO SOLN: 325.0000 mg | ORAL | Status: DC | PRN

## 2019-07-15 MED ORDER — EPINEPHRINE PF 1 MG/ML IJ SOLN
INTRAOCULAR | Status: DC | PRN
  Administered 2019-07-15: 45 mL via OPHTHALMIC

## 2019-07-15 MED ORDER — CEFUROXIME OPHTHALMIC INJECTION 1 MG/0.1 ML
INJECTION | OPHTHALMIC | Status: DC | PRN
  Administered 2019-07-15: 0.1 mL via INTRACAMERAL

## 2019-07-15 MED ORDER — LIDOCAINE HCL (PF) 2 % IJ SOLN
INTRAOCULAR | Status: DC | PRN
  Administered 2019-07-15: 2 mL

## 2019-07-15 MED ORDER — FENTANYL CITRATE (PF) 100 MCG/2ML IJ SOLN
INTRAMUSCULAR | Status: DC | PRN
  Administered 2019-07-15: 50 ug via INTRAVENOUS

## 2019-07-15 MED ORDER — SODIUM HYALURONATE 23 MG/ML IO SOLN
INTRAOCULAR | Status: DC | PRN
  Administered 2019-07-15: 0.6 mL via INTRAOCULAR

## 2019-07-15 MED ORDER — GLYCOPYRROLATE 0.2 MG/ML IJ SOLN
INTRAMUSCULAR | Status: DC | PRN
  Administered 2019-07-15: .1 mg via INTRAVENOUS

## 2019-07-15 MED ORDER — NA HYALUR & NA CHOND-NA HYALUR 0.4-0.35 ML IO KIT
PACK | INTRAOCULAR | Status: DC | PRN
  Administered 2019-07-15: 1 mL via INTRAOCULAR

## 2019-07-15 MED ORDER — ARMC OPHTHALMIC DILATING DROPS
1.0000 "application " | OPHTHALMIC | Status: DC | PRN
  Administered 2019-07-15 (×3): 1 via OPHTHALMIC

## 2019-07-15 MED ORDER — ACETAMINOPHEN 325 MG PO TABS: 325.0000 mg | ORAL_TABLET | ORAL | Status: DC | PRN

## 2019-07-15 MED ORDER — MOXIFLOXACIN HCL 0.5 % OP SOLN
1.0000 [drp] | OPHTHALMIC | Status: DC | PRN
  Administered 2019-07-15 (×3): 1 [drp] via OPHTHALMIC

## 2019-07-15 MED ORDER — HYDRALAZINE HCL 20 MG/ML IJ SOLN
10.0000 mg | Freq: Once | INTRAMUSCULAR | Status: AC
  Administered 2019-07-15: 10 mg via INTRAVENOUS

## 2019-07-15 MED ORDER — MIDAZOLAM HCL 2 MG/2ML IJ SOLN
INTRAMUSCULAR | Status: DC | PRN
  Administered 2019-07-15: 1 mg via INTRAVENOUS

## 2019-07-15 MED ORDER — LIDOCAINE HCL (CARDIAC) PF 100 MG/5ML IV SOSY
PREFILLED_SYRINGE | INTRAVENOUS | Status: DC | PRN
  Administered 2019-07-15: 30 mg via INTRAVENOUS

## 2019-07-15 MED ORDER — TETRACAINE HCL 0.5 % OP SOLN
1.0000 [drp] | OPHTHALMIC | Status: DC | PRN
  Administered 2019-07-15 (×3): 1 [drp] via OPHTHALMIC

## 2019-07-15 MED ORDER — BRIMONIDINE TARTRATE-TIMOLOL 0.2-0.5 % OP SOLN
OPHTHALMIC | Status: DC | PRN
  Administered 2019-07-15: 1 [drp] via OPHTHALMIC

## 2019-07-15 SURGICAL SUPPLY — 20 items
CANNULA ANT/CHMB 27G (MISCELLANEOUS) ×1 IMPLANT
CANNULA ANT/CHMB 27GA (MISCELLANEOUS) ×3 IMPLANT
GLOVE SURG LX 7.5 STRW (GLOVE) ×2
GLOVE SURG LX STRL 7.5 STRW (GLOVE) ×1 IMPLANT
GLOVE SURG TRIUMPH 8.0 PF LTX (GLOVE) ×3 IMPLANT
GOWN STRL REUS W/ TWL LRG LVL3 (GOWN DISPOSABLE) ×2 IMPLANT
GOWN STRL REUS W/TWL LRG LVL3 (GOWN DISPOSABLE) ×4
LENS IOL TECNIS ITEC 20.5 (Intraocular Lens) ×2 IMPLANT
MARKER SKIN DUAL TIP RULER LAB (MISCELLANEOUS) ×3 IMPLANT
NDL FILTER BLUNT 18X1 1/2 (NEEDLE) IMPLANT
NEEDLE FILTER BLUNT 18X 1/2SAF (NEEDLE) ×4
NEEDLE FILTER BLUNT 18X1 1/2 (NEEDLE) ×2 IMPLANT
PACK CATARACT BRASINGTON (MISCELLANEOUS) ×3 IMPLANT
PACK EYE AFTER SURG (MISCELLANEOUS) ×3 IMPLANT
PACK OPTHALMIC (MISCELLANEOUS) ×3 IMPLANT
RING MALYGIN 7.0 (MISCELLANEOUS) ×2 IMPLANT
SYR 3ML LL SCALE MARK (SYRINGE) ×3 IMPLANT
SYR TB 1ML LUER SLIP (SYRINGE) ×3 IMPLANT
WATER STERILE IRR 500ML POUR (IV SOLUTION) ×3 IMPLANT
WIPE NON LINTING 3.25X3.25 (MISCELLANEOUS) ×3 IMPLANT

## 2019-07-15 NOTE — Op Note (Signed)
OPERATIVE NOTE  Megan Mayer [id number] 07/15/2019   PREOPERATIVE DIAGNOSIS:    Mature Nuclear Sclerotic Cataract Right eye with miotic pupil.        H25.11/ h25.89  POSTOPERATIVE DIAGNOSIS: Mature Nuclear Sclerotic Cataract Right eye with miotic pupil.          PROCEDURE:  Phacoemusification with posterior chamber intraocular lens placement of the right eye which required pupil stretching with the Malyugin pupil expansion device and capsule staining with Vision Blue dye Ultrasound time: Procedure(s): CATARACT EXTRACTION PHACO AND INTRAOCULAR LENS PLACEMENT (IOC) RIGHT HEALON 5 VISION BLUE MALYUGIN 0.59,   00:09.4, 15.6% (Right)  LENS:   Implant Name Type Inv. Item Serial No. Manufacturer Lot No. LRB No. Used Action  LENS IOL DIOP 20.5 - [id number] Intraocular Lens LENS IOL DIOP 20.5 [id number] AMO  Right 1 Implanted       SURGEON:  Deirdre Evener, MD   ANESTHESIA:  Topical with tetracaine drops and 2% Xylocaine jelly, augmented with 1% preservative-free intracameral lidocaine.   COMPLICATIONS:  None.   DESCRIPTION OF PROCEDURE:  The patient was identified in the holding room and transported to the operating room and placed in the supine position under the operating microscope. Theright eye was identified as the operative eye and it was prepped and draped in the usual sterile ophthalmic fashion.   A 1 millimeter clear-corneal paracentesis was made at the 12:00 position.  0.5 ml of preservative-free 1% lidocaine was injected into the anterior chamber.  Healon 5 was placed in the anterior chamber followed by Vision blue dye.  The dye and Healon 5 were rinsed ferom the eye. The anterior chamber was filled with more Healon 5 viscoelastic.  A 2.4 millimeter keratome was used to make a near-clear corneal incision at the 9:00 position. A Malyugin pupil expander was then placed through the main incision and into the anterior chamber of the eye.  The edge of the iris was secured on  the lip of the pupil expander and it was released, thereby expanding the pupil to approximately 7 millimeters for completion of the cataract surgery.  Additional Viscoat was placed in the anterior chamber.  A cystotome and capsulorrhexis forceps were used to make a curvilinear capsulorrhexis.   Balanced salt solution was used to hydrodissect and hydrodelineate the lens nucleus.   Phacoemulsification was used in stop and chop fashion to remove the lens, nucleus and epinucleus.  The remaining cortex was aspirated using the irrigation aspiration handpiece.  Additional Provisc was placed into the eye to distend the capsular bag for lens placement.  A lens was then injected into the capsular bag.  The pupil expanding ring was removed using a Kuglen hook and insertion device. The remaining viscoelastic was aspirated from the capsular bag and the anterior chamber.  The anterior chamber was filled with balanced salt solution to inflate to a physiologic pressure.  Wounds were hydrated with balanced salt solution.  The anterior chamber was inflated to a physiologic pressure with balanced salt solution.  No wound leaks were noted.Cefuroxime 0.1 ml of a 10mg /ml solution was injected into the anterior chamber for a dose of 1 mg of intracameral antibiotic at the completion of the case. Timolol and Brimonidine drops were applied to the eye.  The patient was taken to the recovery room in stable condition without complications of anesthesia or surgery.  Megan Mayer 07/15/2019, 11:39 AM

## 2019-07-15 NOTE — Anesthesia Postprocedure Evaluation (Signed)
Anesthesia Post Note  Patient: ARIE GABLE  Procedure(s) Performed: CATARACT EXTRACTION PHACO AND INTRAOCULAR LENS PLACEMENT (IOC) RIGHT HEALON 5 VISION BLUE MALYUGIN 0.59,   00:09.4, 15.6% (Right Eye)     Patient location during evaluation: Phase II Anesthesia Type: MAC Level of consciousness: awake and alert and patient cooperative Pain management: pain level controlled Vital Signs Assessment: vitals unstable and post-procedure vital signs reviewed and stable Respiratory status: spontaneous breathing, nonlabored ventilation and respiratory function stable Cardiovascular status: blood pressure returned to baseline and stable Postop Assessment: no headache and no backache Anesthetic complications: no    Sinda Du

## 2019-07-15 NOTE — Addendum Note (Signed)
Addendum  created 07/15/19 1217 by Omer Jack, CRNA   Intraprocedure Staff edited

## 2019-07-15 NOTE — Anesthesia Procedure Notes (Signed)
Procedure Name: MAC Performed by: Niva Murren M, CRNA Pre-anesthesia Checklist: Timeout performed, Patient being monitored, Suction available, Emergency Drugs available and Patient identified Patient Re-evaluated:Patient Re-evaluated prior to induction Oxygen Delivery Method: Nasal cannula       

## 2019-07-15 NOTE — Anesthesia Preprocedure Evaluation (Signed)
Anesthesia Evaluation  Patient identified by MRN, date of birth, ID band Patient awake    Reviewed: Allergy & Precautions, NPO status , Patient's Chart, lab work & pertinent test results  History of Anesthesia Complications Negative for: history of anesthetic complications  Airway Mallampati: III   Neck ROM: Full    Dental  (+)    Pulmonary Current Smoker and Patient abstained from smoking.,    Pulmonary exam normal breath sounds clear to auscultation       Cardiovascular Exercise Tolerance: Good hypertension, Pt. on medications (-) angina(-) Past MI Normal cardiovascular exam Rhythm:Regular Rate:Normal     Neuro/Psych negative neurological ROS  negative psych ROS   GI/Hepatic negative GI ROS,   Endo/Other  negative endocrine ROS  Renal/GU negative Renal ROS  negative genitourinary   Musculoskeletal   Abdominal Normal abdominal exam  (+) - obese,   Peds negative pediatric ROS (+)  Hematology negative hematology ROS (+)   Anesthesia Other Findings   Reproductive/Obstetrics                            Anesthesia Physical  Anesthesia Plan  ASA: III  Anesthesia Plan: MAC   Post-op Pain Management:    Induction: Intravenous  PONV Risk Score and Plan: 1 and Treatment may vary due to age or medical condition  Airway Management Planned: Natural Airway  Additional Equipment:   Intra-op Plan:   Post-operative Plan:   Informed Consent: I have reviewed the patients History and Physical, chart, labs and discussed the procedure including the risks, benefits and alternatives for the proposed anesthesia with the patient or authorized representative who has indicated his/her understanding and acceptance.     Dental advisory given  Plan Discussed with: CRNA and Anesthesiologist  Anesthesia Plan Comments:         Anesthesia Quick Evaluation  There are no problems to display for  this patient.   CBC Latest Ref Rng & Units 08/02/2017 07/17/2017 08/04/2014  WBC 3.6 - 11.0 K/uL 6.4 8.5 7.0  Hemoglobin 12.0 - 16.0 g/dL 12.6 13.2 10.8(L)  Hematocrit 35.0 - 47.0 % 38.0 39.7 -  Platelets 150 - 440 K/uL 433 409 464(H)   BMP Latest Ref Rng & Units 07/17/2017 08/04/2014 08/03/2014  Glucose 65 - 99 mg/dL 118(H) 89 107(H)  BUN 6 - 20 mg/dL _0 Creatinine 0.44 - 1.00 mg/dL 0.63 0.48(L) 0.54(L)  Sodium 135 - 145 mmol/L 139 136 137  Potassium 3.5 - 5.1 mmol/L 3.0(L) 3.9 4.1  Chloride 101 - 111 mmol/L 108 105 104  CO2 22 - 32 mmol/L 21(L) 24 23  Calcium 8.9 - 10.3 mg/dL 8.1(L) 7.8(L) 7.4(L)   Risks and benefits of anesthesia discussed at length, patient or surrogate demonstrates understanding. Appropriately NPO. Plan to proceed with anesthesia. Slightly elevated BP compared to last time, although patient took prescribed medications this AM. Denies headache, vision changes, chest pain, SOB, or any symptoms. 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 Has a BP cuff at home, but it is "out of batteries" and she has not taken it for many months. Plan to treat acute BP elevation with hydralazine and patient will follow up with PCP ASAP.  Champ Mungo, MD 07/15/19

## 2019-07-15 NOTE — Anesthesia Procedure Notes (Signed)
Procedure Name: MAC Performed by: Izetta Dakin, CRNA Pre-anesthesia Checklist: Timeout performed, Patient being monitored, Suction available, Emergency Drugs available and Patient identified Patient Re-evaluated:Patient Re-evaluated prior to induction Oxygen Delivery Method: Nasal cannula

## 2019-07-15 NOTE — Addendum Note (Signed)
Addendum  created 07/15/19 1205 by Omer Jack, CRNA   Attestation recorded in Intraprocedure, Child order released for a procedure order, Clinical Note Signed, Flowsheet accepted, Intraprocedure Attestations filed, Scientist, water quality edited, International aid/development worker edited

## 2019-07-15 NOTE — H&P (Signed)

## 2019-07-15 NOTE — Transfer of Care (Signed)
Immediate Anesthesia Transfer of Care Note  Patient: DANISA KOPEC  Procedure(s) Performed: CATARACT EXTRACTION PHACO AND INTRAOCULAR LENS PLACEMENT (IOC) RIGHT HEALON 5 VISION BLUE MALYUGIN 0.59,   00:09.4, 15.6% (Right Eye)  Patient Location: PACU  Anesthesia Type: MAC  Level of Consciousness: awake, alert  and patient cooperative  Airway and Oxygen Therapy: Patient Spontanous Breathing and Patient connected to supplemental oxygen  Post-op Assessment: Post-op Vital signs reviewed, Patient's Cardiovascular Status Stable, Respiratory Function Stable, Patent Airway and No signs of Nausea or vomiting  Post-op Vital Signs: Reviewed and stable  Complications: No apparent anesthesia complications

## 2019-07-16 ENCOUNTER — Encounter: Payer: Self-pay | Admitting: *Deleted

## 2019-12-23 ENCOUNTER — Other Ambulatory Visit: Payer: Self-pay | Admitting: Family Medicine

## 2019-12-23 ENCOUNTER — Other Ambulatory Visit: Payer: Self-pay | Admitting: Physician Assistant

## 2019-12-23 DIAGNOSIS — Z1231 Encounter for screening mammogram for malignant neoplasm of breast: Secondary | ICD-10-CM

## 2020-01-04 ENCOUNTER — Telehealth (INDEPENDENT_AMBULATORY_CARE_PROVIDER_SITE_OTHER): Payer: Self-pay | Admitting: Gastroenterology

## 2020-01-04 ENCOUNTER — Other Ambulatory Visit: Payer: Self-pay

## 2020-01-04 DIAGNOSIS — Z86010 Personal history of colonic polyps: Secondary | ICD-10-CM

## 2020-01-04 MED ORDER — NA SULFATE-K SULFATE-MG SULF 17.5-3.13-1.6 GM/177ML PO SOLN: 1.0000 | Freq: Once | ORAL | 0 refills | Status: AC

## 2020-01-04 NOTE — Progress Notes (Signed)
Gastroenterology Pre-Procedure Review  Request Date: 01/21/20 Requesting Physician: Dr. Vicente Males  PATIENT REVIEW QUESTIONS: The patient responded to the following health history questions as indicated:    1. Are you having any GI issues? yes (stomach pain and diarrhea) 2. Do you have a personal history of Polyps? yes (2018 with Dr. Clovis Cao) 3. Do you have a family history of Colon Cancer or Polyps? no 4. Diabetes Mellitus? no 5. Joint replacements in the past 12 months?no 6. Major health problems in the past 3 months?no 7. Any artificial heart valves, MVP, or defibrillator?no    MEDICATIONS & ALLERGIES:    Patient reports the following regarding taking any anticoagulation/antiplatelet therapy:   Plavix, Coumadin, Eliquis, Xarelto, Lovenox, Pradaxa, Brilinta, or Effient? no Aspirin? no  Patient confirms/reports the following medications:  Current Outpatient Medications  Medication Sig Dispense Refill  . atorvastatin (LIPITOR) 20 MG tablet Take 20 mg by mouth daily.    . DULoxetine (CYMBALTA) 30 MG capsule Take 30 mg by mouth 2 (two) times daily.    . DUREZOL 0.05 % EMUL Apply 1 drop to eye daily.    Marland Kitchen ketorolac (ACULAR) 0.5 % ophthalmic solution 1 drop daily.    Marland Kitchen lisinopril (ZESTRIL) 20 MG tablet Take 20 mg by mouth daily.    . Na Sulfate-K Sulfate-Mg Sulf 17.5-3.13-1.6 GM/177ML SOLN Take 1 kit by mouth once for 1 dose. 354 mL 0  . potassium chloride (KLOR-CON 10) 10 MEQ tablet Take 1 tablet (10 mEq total) by mouth daily. 30 tablet 0  . traZODone (DESYREL) 50 MG tablet     . cloNIDine (CATAPRES) 0.1 MG tablet Take 1 tablet (0.1 mg total) by mouth daily. 30 tablet 11   No current facility-administered medications for this visit.    Patient confirms/reports the following allergies:  No Known Allergies  Orders Placed This Encounter  Procedures  . Procedural/ Surgical Case Request: COLONOSCOPY WITH PROPOFOL    Standing Status:   Standing    Number of Occurrences:   1    Order  Specific Question:   Pre-op diagnosis    Answer:   personal history of colon polyps    Order Specific Question:   CPT Code    Answer:   29798    AUTHORIZATION INFORMATION Primary Insurance: 1D#: Group #:  Secondary Insurance: 1D#: Group #:  SCHEDULE INFORMATION: Date: Thursday 01/21/20 Time: Location:[facility]

## 2020-01-19 ENCOUNTER — Other Ambulatory Visit: Payer: Medicare Other | Attending: Gastroenterology

## 2020-01-21 ENCOUNTER — Other Ambulatory Visit: Payer: Self-pay

## 2020-01-21 ENCOUNTER — Ambulatory Visit: Payer: Medicare Other | Admitting: Certified Registered Nurse Anesthetist

## 2020-01-21 ENCOUNTER — Ambulatory Visit
Admission: RE | Admit: 2020-01-21 | Discharge: 2020-01-21 | Disposition: A | Discharge status: Home / Self Care | Payer: Medicare Other | Attending: Gastroenterology | Admitting: Gastroenterology

## 2020-01-21 ENCOUNTER — Encounter: Payer: Self-pay | Admitting: Gastroenterology

## 2020-01-21 ENCOUNTER — Encounter
Admission: RE | Disposition: A | Discharge status: Home / Self Care | Payer: Self-pay | Source: Home / Self Care | Attending: Gastroenterology

## 2020-01-21 DIAGNOSIS — Z9842 Cataract extraction status, left eye: Secondary | ICD-10-CM | POA: Insufficient documentation

## 2020-01-21 DIAGNOSIS — Z5309 Procedure and treatment not carried out because of other contraindication: Secondary | ICD-10-CM | POA: Insufficient documentation

## 2020-01-21 DIAGNOSIS — Z791 Long term (current) use of non-steroidal anti-inflammatories (NSAID): Secondary | ICD-10-CM | POA: Diagnosis not present

## 2020-01-21 DIAGNOSIS — F329 Major depressive disorder, single episode, unspecified: Secondary | ICD-10-CM | POA: Insufficient documentation

## 2020-01-21 DIAGNOSIS — F17200 Nicotine dependence, unspecified, uncomplicated: Secondary | ICD-10-CM | POA: Insufficient documentation

## 2020-01-21 DIAGNOSIS — F419 Anxiety disorder, unspecified: Secondary | ICD-10-CM | POA: Insufficient documentation

## 2020-01-21 DIAGNOSIS — Z90411 Acquired partial absence of pancreas: Secondary | ICD-10-CM | POA: Insufficient documentation

## 2020-01-21 DIAGNOSIS — Z961 Presence of intraocular lens: Secondary | ICD-10-CM | POA: Insufficient documentation

## 2020-01-21 DIAGNOSIS — Z1211 Encounter for screening for malignant neoplasm of colon: Secondary | ICD-10-CM | POA: Diagnosis present

## 2020-01-21 DIAGNOSIS — Z9841 Cataract extraction status, right eye: Secondary | ICD-10-CM | POA: Diagnosis not present

## 2020-01-21 DIAGNOSIS — F17210 Nicotine dependence, cigarettes, uncomplicated: Secondary | ICD-10-CM | POA: Diagnosis not present

## 2020-01-21 DIAGNOSIS — Z79899 Other long term (current) drug therapy: Secondary | ICD-10-CM | POA: Insufficient documentation

## 2020-01-21 DIAGNOSIS — Z86010 Personal history of colonic polyps: Secondary | ICD-10-CM | POA: Diagnosis not present

## 2020-01-21 DIAGNOSIS — I10 Essential (primary) hypertension: Secondary | ICD-10-CM | POA: Diagnosis not present

## 2020-01-21 HISTORY — DX: Anxiety disorder, unspecified: F41.9

## 2020-01-21 HISTORY — DX: Depression, unspecified: F32.A

## 2020-01-21 HISTORY — PX: COLONOSCOPY WITH PROPOFOL: SHX5780

## 2020-01-21 SURGERY — COLONOSCOPY WITH PROPOFOL
Anesthesia: General

## 2020-01-21 MED ORDER — LIDOCAINE HCL (PF) 2 % IJ SOLN
INTRAMUSCULAR | Status: AC
  Filled 2020-01-21: qty 5

## 2020-01-21 MED ORDER — SODIUM CHLORIDE 0.9 % IV SOLN
INTRAVENOUS | Status: DC
  Administered 2020-01-21: 1000 mL via INTRAVENOUS

## 2020-01-21 MED ORDER — PROPOFOL 10 MG/ML IV BOLUS
INTRAVENOUS | Status: DC | PRN
  Administered 2020-01-21: 50 mg via INTRAVENOUS

## 2020-01-21 MED ORDER — PROPOFOL 500 MG/50ML IV EMUL
INTRAVENOUS | Status: AC
  Filled 2020-01-21: qty 50

## 2020-01-21 MED ORDER — PROPOFOL 500 MG/50ML IV EMUL
INTRAVENOUS | Status: DC | PRN
  Administered 2020-01-21: 150 ug/kg/min via INTRAVENOUS

## 2020-01-21 MED ORDER — LIDOCAINE HCL (CARDIAC) PF 100 MG/5ML IV SOSY
PREFILLED_SYRINGE | INTRAVENOUS | Status: DC | PRN
  Administered 2020-01-21: 50 mg via INTRAVENOUS

## 2020-01-21 NOTE — Transfer of Care (Signed)
Immediate Anesthesia Transfer of Care Note  Patient: CATHRYN GALLERY  Procedure(s) Performed: COLONOSCOPY WITH PROPOFOL (N/A )  Patient Location: PACU  Anesthesia Type:General  Level of Consciousness: awake and drowsy  Airway & Oxygen Therapy: Patient Spontanous Breathing and Patient connected to nasal cannula oxygen  Post-op Assessment: Report given to RN and Post -op Vital signs reviewed and stable  Post vital signs: Reviewed and stable  Last Vitals:  Vitals Value Taken Time  BP 134/100 01/21/20 1207  Temp 36.1 C 01/21/20 1207  Pulse 67 01/21/20 1210  Resp 16 01/21/20 1210  SpO2 98 % 01/21/20 1210  Vitals shown include unvalidated device data.  Last Pain:  Vitals:   01/21/20 1207  TempSrc: Tympanic  PainSc: Asleep         Complications: No complications documented.

## 2020-01-21 NOTE — H&P (Signed)
Megan Mood, MD [address], [place], [place], [address] [address], [place], [place], [address] Phone: [phone]  Fax: [phone]  Primary Care Physician:  Megan Fleeting, MD   Pre-Procedure History & Physical: HPI:  Megan Mayer is a 54 y.o. female is here for an colonoscopy.   Past Medical History:  Diagnosis Date   Anxiety    Depression    Hypertension     Past Surgical History:  Procedure Laterality Date   CATARACT EXTRACTION W/PHACO Left 06/24/2019   Procedure: CATARACT EXTRACTION PHACO AND INTRAOCULAR LENS PLACEMENT (IOC) LEFT MALYUGIN  0.59  00:11.9  4.9%;  Surgeon: Lockie Mola, MD;  Location: [facility];  Service: Ophthalmology;  Laterality: Left;   CATARACT EXTRACTION W/PHACO Right 07/15/2019   Procedure: CATARACT EXTRACTION PHACO AND INTRAOCULAR LENS PLACEMENT (IOC) RIGHT HEALON 5 VISION BLUE MALYUGIN 0.59,   00:09.4, 15.6%;  Surgeon: Lockie Mola, MD;  Location: [facility];  Service: Ophthalmology;  Laterality: Right;   PANCREAS SURGERY  2015   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 Authorizing Provider  atorvastatin (LIPITOR) 20 MG tablet Take 20 mg by mouth daily. 12/21/19  Yes [provider]  cloNIDine (CATAPRES) 0.1 MG tablet Take 1 tablet (0.1 mg total) by mouth daily. 07/17/17 01/21/20 Yes Phineas Semen, MD  DULoxetine (CYMBALTA) 30 MG capsule Take 30 mg by mouth 2 (two) times daily. 12/21/19  Yes [provider]  DUREZOL 0.05 % EMUL Apply 1 drop to eye daily. 12/11/19  Yes [provider]  ketorolac (ACULAR) 0.5 % ophthalmic solution 1 drop daily. 12/11/19  Yes [provider]  lisinopril (ZESTRIL) 20 MG tablet Take 20 mg by mouth daily. 12/21/19  Yes [provider]  potassium chloride (KLOR-CON 10) 10 MEQ tablet Take 1 tablet (10 mEq total) by mouth daily. 07/17/17  Yes Phineas Semen, MD  traZODone (DESYREL) 50 MG tablet  12/21/19  Yes [provider]    Allergies as of 01/04/2020   (No Known Allergies)    Family History  Problem Relation Age of Onset   Breast cancer Neg Hx     Social History   Socioeconomic History   Marital status: Single    Spouse name: Not on file   Number of children: Not on file   Years of education: Not on file   Highest education level: Not on file  Occupational History   Not on file  Tobacco Use   Smoking status: Current Every Day Smoker    Packs/day: 0.50    Types: Cigarettes   Smokeless tobacco: Never Used  Vaping Use   Vaping Use: Never used  Substance and Sexual Activity   Alcohol use: Not on file   Drug use: Not on file   Sexual activity: Not on file  Other Topics Concern   Not on file  Social History Narrative   Not on file   Social Determinants of Health   Financial Resource Strain:    Difficulty of Paying Living Expenses:   Food Insecurity:    Worried About Running Out of Food in the Last Year:    Barista in the Last Year:   Transportation Needs:    Freight forwarder (Medical):    Lack of Transportation (Non-Medical):   Physical Activity:    Days of Exercise per Week:    Minutes of Exercise per Session:  Stress:    Feeling of Stress :   Social Connections:    Frequency of Communication with Friends and Family:    Frequency of Social Gatherings with Friends and Family:    Attends Religious Services:    Active Member of Clubs or Organizations:    Attends Engineer, structural:    Marital Status:   Intimate Partner Violence:    Fear of Current or Ex-Partner:    Emotionally Abused:    Physically Abused:    Sexually Abused:     Review of Systems: See HPI, otherwise negative ROS  Physical Exam: BP (!) 171/111    Pulse 65    Temp (!) 96.9 F (36.1 C) (Tympanic)    Resp 18    Ht 5\' 2"  (1.575 m)    Wt 43.5 kg    SpO2 100%    BMI  17.56 kg/m  General:   Alert,  pleasant and cooperative in NAD Head:  Normocephalic and atraumatic. Neck:  Supple; no masses or thyromegaly. Lungs:  Clear throughout to auscultation, normal respiratory effort.    Heart:  +S1, +S2, Regular rate and rhythm, No edema. Abdomen:  Soft, nontender and nondistended. Normal bowel sounds, without guarding, and without rebound.   Neurologic:  Alert and  oriented x4;  grossly normal neurologically.  Impression/Plan: is here for an colonoscopy to be performed for surveillance due to prior history of colon polyps   Risks, benefits, limitations, and alternatives regarding  colonoscopy have been reviewed with the patient.  Questions have been answered.  All parties agreeable.   Alphonzo Grieve, MD  01/21/2020, 10:46 AM

## 2020-01-21 NOTE — Op Note (Addendum)
[facility] Gastroenterology Patient Name: Megan Mayer Procedure Date: 01/21/2020 11:32 AM MRN: [id number] Account #: [id number] Date of Birth: 1965/12/26 Admit Type: Outpatient Age: 54 Room: [facility] ENDO ROOM 1 Gender: Female Note Status: Finalized Procedure:             Colonoscopy Indications:           High risk colon cancer surveillance: Personal history                         of colonic polyps Providers:             [name] MD, MD Referring MD:          Preston Fleeting (Referring MD) Medicines:             Monitored Anesthesia Care Complications:         No immediate complications. Procedure:             Pre-Anesthesia Assessment:                        - Prior to the procedure, a History and Physical was                         performed, and patient medications, allergies and                         sensitivities were reviewed. The patient's tolerance                         of previous anesthesia was reviewed.                        - The risks and benefits of the procedure and the                         sedation options and risks were discussed with the                         patient. All questions were answered and informed                         consent was obtained.                        - ASA Grade Assessment: II - A patient with mild                         systemic disease.                        After obtaining informed consent, the colonoscope was                         passed under direct vision. Throughout the procedure,                         the patient's blood pressure, pulse, and oxygen                         saturations were monitored continuously. The  Colonoscope was introduced through the anus with the                         intention of advancing to the cecum. The scope was                         advanced to the sigmoid colon before the procedure was                         aborted.  Medications were given. The colonoscopy was                         performed with ease. The patient tolerated the                         procedure well. The quality of the bowel preparation                         was poor. Findings:      The perianal and digital rectal examinations were normal.      A large amount of stool was found in the rectum and in the sigmoid       colon, precluding visualization.      The perianal and digital rectal examinations were normal. Impression:            - Preparation of the colon was poor.                        - Stool in the rectum and in the sigmoid colon.                        - No specimens collected. Recommendation:        - Discharge patient to home (with escort).                        - Resume previous diet.                        - Continue present medications.                        - Repeat colonoscopy in 2 weeks because the bowel                         preparation was suboptimal. Procedure Code(s):     --- Professional ---                        [phone], 53, Colonoscopy, flexible; diagnostic,                         including collection of specimen(s) by brushing or                         washing, when performed (separate procedure) Diagnosis Code(s):     --- Professional ---                        Z86.010, Personal history of colonic polyps CPT copyright 2019 American Medical Association. All rights  reserved. The codes documented in this report are preliminary and upon coder review may  be revised to meet current compliance requirements. [name], MD [name] MD, MD 01/21/2020 12:03:47 PM This report has been signed electronically. Number of Addenda: 0 Note Initiated On: 01/21/2020 11:32 AM Total Procedure Duration: 0 hours 4 minutes 45 seconds  Estimated Blood Loss:  Estimated blood loss: none.      [facility]

## 2020-01-21 NOTE — Anesthesia Preprocedure Evaluation (Signed)
Anesthesia Evaluation  Patient identified by MRN, date of birth, ID band Patient awake    Reviewed: Allergy & Precautions, H&P , NPO status , Patient's Chart, lab work & pertinent test results, reviewed documented beta blocker date and time   History of Anesthesia Complications Negative for: history of anesthetic complications  Airway Mallampati: II  TM Distance: >3 FB Neck ROM: full    Dental  (+) Dental Advidsory Given, Teeth Intact, Poor Dentition   Pulmonary neg shortness of breath, neg COPD, neg recent URI, Current Smoker and Patient abstained from smoking.,    Pulmonary exam normal breath sounds clear to auscultation       Cardiovascular Exercise Tolerance: Good hypertension, (-) angina(-) Past MI and (-) Cardiac Stents Normal cardiovascular exam(-) dysrhythmias (-) Valvular Problems/Murmurs Rhythm:regular Rate:Normal     Neuro/Psych PSYCHIATRIC DISORDERS Anxiety Depression negative neurological ROS     GI/Hepatic negative GI ROS, Neg liver ROS,   Endo/Other  negative endocrine ROS  Renal/GU negative Renal ROS  negative genitourinary   Musculoskeletal   Abdominal   Peds  Hematology negative hematology ROS (+)   Anesthesia Other Findings Past Medical History: No date: Anxiety No date: Depression No date: Hypertension   Reproductive/Obstetrics negative OB ROS                             Anesthesia Physical Anesthesia Plan  ASA: II  Anesthesia Plan: General   Post-op Pain Management:    Induction: Intravenous  PONV Risk Score and Plan: 2 and Propofol infusion and TIVA  Airway Management Planned: Natural Airway and Nasal Cannula  Additional Equipment:   Intra-op Plan:   Post-operative Plan:   Informed Consent: I have reviewed the patients History and Physical, chart, labs and discussed the procedure including the risks, benefits and alternatives for the proposed  anesthesia with the patient or authorized representative who has indicated his/her understanding and acceptance.     Dental Advisory Given  Plan Discussed with: Anesthesiologist, CRNA and Surgeon  Anesthesia Plan Comments:         Anesthesia Quick Evaluation

## 2020-01-21 NOTE — Anesthesia Postprocedure Evaluation (Signed)
Anesthesia Post Note  Patient: Megan Mayer  Procedure(s) Performed: COLONOSCOPY WITH PROPOFOL (N/A )  Patient location during evaluation: Endoscopy Anesthesia Type: General Level of consciousness: awake and alert Pain management: pain level controlled Vital Signs Assessment: post-procedure vital signs reviewed and stable Respiratory status: spontaneous breathing, nonlabored ventilation, respiratory function stable and patient connected to nasal cannula oxygen Cardiovascular status: blood pressure returned to baseline and stable Postop Assessment: no apparent nausea or vomiting Anesthetic complications: no   No complications documented.   Last Vitals:  Vitals:   01/21/20 1217 01/21/20 1227  BP: (!) 145/88 (!) 183/107  Pulse: (!) 58 69  Resp: 14 19  Temp:    SpO2: 99% 99%    Last Pain:  Vitals:   01/21/20 1227  TempSrc:   PainSc: 0-No pain                 Lenard Simmer

## 2020-01-21 NOTE — Op Note (Addendum)
[facility] Gastroenterology Patient Name: Megan Mayer Procedure Date: 01/21/2020 10:09 AM MRN: [id number] Date of Birth: 03-25-1966 Admit Type: Outpatient Age: 54 Room: [facility] ENDO ROOM 1 Gender: Female Note Status: Finalized THIS EXAM WAS SENT IN ERROR

## 2020-01-22 ENCOUNTER — Encounter: Payer: Self-pay | Admitting: Gastroenterology

## 2020-01-28 ENCOUNTER — Other Ambulatory Visit: Payer: Self-pay

## 2020-02-04 ENCOUNTER — Other Ambulatory Visit: Payer: Self-pay

## 2020-02-04 DIAGNOSIS — Z86010 Personal history of colonic polyps: Secondary | ICD-10-CM

## 2020-02-04 MED ORDER — MAGNESIUM CITRATE PO SOLN
1.0000 | Freq: Once | ORAL | 0 refills | Status: AC
Start: 2020-02-04 — End: 2020-02-04

## 2020-02-04 MED ORDER — NA SULFATE-K SULFATE-MG SULF 17.5-3.13-1.6 GM/177ML PO SOLN: 354.0000 mL | Freq: Once | ORAL | 0 refills | Status: AC

## 2020-03-01 ENCOUNTER — Telehealth: Payer: Self-pay

## 2020-03-01 ENCOUNTER — Other Ambulatory Visit: Admission: RE | Admit: 2020-03-01 | Payer: Medicare Other | Source: Ambulatory Visit

## 2020-03-01 NOTE — Telephone Encounter (Signed)
Received message from front office patient has requested to cancel her colonoscopy .  She said she will call back to reschedule at a later time.  [facility] has been informed.  Referral noted.  Thanks,  [name], [place]

## 2020-03-03 ENCOUNTER — Ambulatory Visit: Admission: RE | Admit: 2020-03-03 | Payer: Medicare Other | Source: Home / Self Care | Admitting: Gastroenterology

## 2020-03-03 ENCOUNTER — Encounter: Admission: RE | Payer: Self-pay | Source: Home / Self Care

## 2020-03-03 SURGERY — COLONOSCOPY WITH PROPOFOL
Anesthesia: General

## 2020-03-16 ENCOUNTER — Telehealth (INDEPENDENT_AMBULATORY_CARE_PROVIDER_SITE_OTHER): Payer: Self-pay | Admitting: Gastroenterology

## 2020-03-16 DIAGNOSIS — Z86010 Personal history of colonic polyps: Secondary | ICD-10-CM

## 2020-03-16 NOTE — Progress Notes (Signed)
   Gastroenterology Pre-Procedure Review  Request Date: 04/25/20 Requesting Physician: Dr. Vicente Males  PATIENT REVIEW QUESTIONS: The patient responded to the following health history questions as indicated:    1. Are you having any GI issues? yes (stomach pain and diarrhea) 2. Do you have a personal history of Polyps? yes (2018 with Dr. Clovis Cao) 3. Do you have a family history of Colon Cancer or Polyps? no 4. Diabetes Mellitus? no 5. Joint replacements in the past 12 months?no 6. Major health problems in the past 3 months?no 7. Any artificial heart valves, MVP, or defibrillator?no    MEDICATIONS & ALLERGIES:    Patient reports the following regarding taking any anticoagulation/antiplatelet therapy:   Plavix, Coumadin, Eliquis, Xarelto, Lovenox, Pradaxa, Brilinta, or Effient? no Aspirin? no  Patient confirms/reports the following medications:        Current Outpatient Medications  Medication Sig Dispense Refill  . atorvastatin (LIPITOR) 20 MG tablet Take 20 mg by mouth daily.    . DULoxetine (CYMBALTA) 30 MG capsule Take 30 mg by mouth 2 (two) times daily.    . DUREZOL 0.05 % EMUL Apply 1 drop to eye daily.    Marland Kitchen ketorolac (ACULAR) 0.5 % ophthalmic solution 1 drop daily.    Marland Kitchen lisinopril (ZESTRIL) 20 MG tablet Take 20 mg by mouth daily.    . Na Sulfate-K Sulfate-Mg Sulf 17.5-3.13-1.6 GM/177ML SOLN Take 1 kit by mouth once for 1 dose. 354 mL 0  . potassium chloride (KLOR-CON 10) 10 MEQ tablet Take 1 tablet (10 mEq total) by mouth daily. 30 tablet 0  . traZODone (DESYREL) 50 MG tablet     . cloNIDine (CATAPRES) 0.1 MG tablet Take 1 tablet (0.1 mg total) by mouth daily. 30 tablet 11   No current facility-administered medications for this visit.    Patient confirms/reports the following allergies:  No Known Allergies       Orders Placed This Encounter  Procedures  . Procedural/ Surgical Case Request: COLONOSCOPY WITH PROPOFOL    Standing Status:   Standing     Number of Occurrences:   1    Order Specific Question:   Pre-op diagnosis    Answer:   personal history of colon polyps    Order Specific Question:   CPT Code    Answer:   28206    AUTHORIZATION INFORMATION Primary Insurance: 1D#: Group #:  Secondary Insurance: 1D#: Group #:  SCHEDULE INFORMATION: Date: Monday 04/25/20 Time: Location:[facility]

## 2020-03-17 ENCOUNTER — Other Ambulatory Visit: Payer: Self-pay

## 2020-03-17 MED ORDER — NA SULFATE-K SULFATE-MG SULF 17.5-3.13-1.6 GM/177ML PO SOLN: 354.0000 mL | Freq: Once | ORAL | 0 refills | Status: AC

## 2020-03-17 NOTE — Progress Notes (Unsigned)
Megan Mayer scheduled patient for colonoscopy for 04/25/2020. No instructions were mailed to patient. Fixed instructions and mailed them to patient and sent prep to the pharmacy

## 2020-04-21 ENCOUNTER — Other Ambulatory Visit
Admission: RE | Admit: 2020-04-21 | Discharge: 2020-04-21 | Disposition: A | Discharge status: Home / Self Care | Payer: Medicare Other | Source: Ambulatory Visit | Attending: Gastroenterology | Admitting: Gastroenterology

## 2020-04-21 ENCOUNTER — Other Ambulatory Visit: Payer: Self-pay

## 2020-04-21 DIAGNOSIS — Z01812 Encounter for preprocedural laboratory examination: Secondary | ICD-10-CM | POA: Diagnosis present

## 2020-04-21 DIAGNOSIS — Z20822 Contact with and (suspected) exposure to covid-19: Secondary | ICD-10-CM | POA: Insufficient documentation

## 2020-04-21 LAB — SARS CORONAVIRUS 2 (TAT 6-24 HRS): SARS Coronavirus 2: NEGATIVE

## 2020-04-22 ENCOUNTER — Encounter: Payer: Self-pay | Admitting: Gastroenterology

## 2020-04-25 ENCOUNTER — Ambulatory Visit: Payer: Medicare Other | Admitting: Certified Registered Nurse Anesthetist

## 2020-04-25 ENCOUNTER — Encounter
Admission: RE | Disposition: A | Discharge status: Home / Self Care | Payer: Self-pay | Source: Home / Self Care | Attending: Gastroenterology

## 2020-04-25 ENCOUNTER — Ambulatory Visit
Admission: RE | Admit: 2020-04-25 | Discharge: 2020-04-25 | Disposition: A | Discharge status: Home / Self Care | Payer: Medicare Other | Attending: Gastroenterology | Admitting: Gastroenterology

## 2020-04-25 ENCOUNTER — Other Ambulatory Visit: Payer: Self-pay

## 2020-04-25 DIAGNOSIS — Z09 Encounter for follow-up examination after completed treatment for conditions other than malignant neoplasm: Secondary | ICD-10-CM | POA: Insufficient documentation

## 2020-04-25 DIAGNOSIS — F329 Major depressive disorder, single episode, unspecified: Secondary | ICD-10-CM | POA: Diagnosis not present

## 2020-04-25 DIAGNOSIS — I10 Essential (primary) hypertension: Secondary | ICD-10-CM | POA: Insufficient documentation

## 2020-04-25 DIAGNOSIS — Z79899 Other long term (current) drug therapy: Secondary | ICD-10-CM | POA: Insufficient documentation

## 2020-04-25 DIAGNOSIS — F17210 Nicotine dependence, cigarettes, uncomplicated: Secondary | ICD-10-CM | POA: Insufficient documentation

## 2020-04-25 DIAGNOSIS — Z86010 Personal history of colonic polyps: Secondary | ICD-10-CM | POA: Diagnosis not present

## 2020-04-25 DIAGNOSIS — F419 Anxiety disorder, unspecified: Secondary | ICD-10-CM | POA: Diagnosis not present

## 2020-04-25 HISTORY — PX: COLONOSCOPY WITH PROPOFOL: SHX5780

## 2020-04-25 SURGERY — COLONOSCOPY WITH PROPOFOL
Anesthesia: General

## 2020-04-25 MED ORDER — HYDRALAZINE HCL 20 MG/ML IJ SOLN
INTRAMUSCULAR | Status: DC | PRN
  Administered 2020-04-25: 5 mg via INTRAVENOUS

## 2020-04-25 MED ORDER — PROPOFOL 500 MG/50ML IV EMUL
INTRAVENOUS | Status: AC
  Filled 2020-04-25: qty 200

## 2020-04-25 MED ORDER — GLYCOPYRROLATE 0.2 MG/ML IJ SOLN
INTRAMUSCULAR | Status: AC
  Filled 2020-04-25: qty 1

## 2020-04-25 MED ORDER — LABETALOL HCL 5 MG/ML IV SOLN
INTRAVENOUS | Status: DC | PRN
  Administered 2020-04-25: 10 mg via INTRAVENOUS

## 2020-04-25 MED ORDER — LIDOCAINE HCL (PF) 2 % IJ SOLN
INTRAMUSCULAR | Status: AC
  Filled 2020-04-25: qty 15

## 2020-04-25 MED ORDER — SODIUM CHLORIDE 0.9 % IV SOLN
INTRAVENOUS | Status: DC
  Administered 2020-04-25: 20 mL/h via INTRAVENOUS

## 2020-04-25 NOTE — H&P (Signed)
Jonathon Bellows, MD [address], [place], [place], [place], [address] [address], [place], [place], [place], [address] Phone: [phone]  Fax: [phone]  Primary Care Physician:  Theotis Burrow, MD   Pre-Procedure History & Physical: HPI:  Megan Mayer is a 54 y.o. female is here for an colonoscopy.   Past Medical History:  Diagnosis Date  . Anxiety   . Depression   . Hypertension     Past Surgical History:  Procedure Laterality Date  . CATARACT EXTRACTION W/PHACO Left 06/24/2019   Procedure: CATARACT EXTRACTION PHACO AND INTRAOCULAR LENS PLACEMENT (IOC) LEFT MALYUGIN  0.59  00:11.9  4.9%;  Surgeon: Leandrew Koyanagi, MD;  Location: [facility];  Service: Ophthalmology;  Laterality: Left;  . CATARACT EXTRACTION W/PHACO Right 07/15/2019   Procedure: CATARACT EXTRACTION PHACO AND INTRAOCULAR LENS PLACEMENT (IOC) RIGHT HEALON 5 VISION BLUE MALYUGIN 0.59,   00:09.4, 15.6%;  Surgeon: Leandrew Koyanagi, MD;  Location: [facility];  Service: Ophthalmology;  Laterality: Right;  . COLONOSCOPY WITH PROPOFOL N/A 01/21/2020   Procedure: COLONOSCOPY WITH PROPOFOL;  Surgeon: Jonathon Bellows, MD;  Location: [facility] ENDOSCOPY;  Service: Gastroenterology;  Laterality: N/A;  . EYE SURGERY    . 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 Authorizing Provider  atorvastatin (LIPITOR) 20 MG tablet Take 20 mg by mouth daily. 12/21/19  Yes [provider]  [facility] CITRATE OF MAGNESIA SOLN Take 296 mLs by mouth once. 02/04/20  Yes [provider]  DULoxetine (CYMBALTA) 30 MG capsule Take 30 mg by mouth 2 (two) times daily. 12/21/19  Yes [provider]  DUREZOL 0.05 % EMUL Apply 1 drop to eye daily. 12/11/19  Yes [provider]  ketorolac (ACULAR) 0.5 % ophthalmic solution 1 drop daily. 12/11/19  Yes [provider]  lisinopril (ZESTRIL) 20 MG tablet  Take 20 mg by mouth daily. 12/21/19  Yes [provider]  nicotine (NICODERM CQ - DOSED IN MG/24 HOURS) 14 mg/24hr patch SMARTSIG:Topical 12/21/19  Yes [provider]  nicotine polacrilex (NICORETTE) 2 MG gum SMARTSIG:1 Each By Mouth Every 2-3 Hours 12/21/19  Yes [provider]  potassium chloride (KLOR-CON 10) 10 MEQ tablet Take 1 tablet (10 mEq total) by mouth daily. 07/17/17  Yes Nance Pear, MD  SUPREP BOWEL PREP KIT 17.5-3.13-1.6 GM/177ML SOLN Take by mouth as directed.  02/04/20  Yes [provider]  traZODone (DESYREL) 50 MG tablet  12/21/19  Yes [provider]  cloNIDine (CATAPRES) 0.1 MG tablet Take 1 tablet (0.1 mg total) by mouth daily. 07/17/17 01/21/20  Nance Pear, MD    Allergies as of 03/16/2020  . (No Known Allergies)    Family History  Problem Relation Age of Onset  . Breast cancer Neg Hx     Social History   Socioeconomic History  . Marital status: Single    Spouse name: Not on file  . Number of children: Not on file  . Years of education: Not on file  . Highest education level: Not on file  Occupational History  . Not on file  Tobacco Use  . Smoking status: Current Every Day Smoker    Packs/day: 0.50    Types: Cigarettes  . Smokeless tobacco: Never Used  Vaping Use  . Vaping Use: Never used  Substance and Sexual Activity  . Alcohol use: Not on file  . Drug use: Not on file  .  Sexual activity: Not on file  Other Topics Concern  . Not on file  Social History Narrative  . Not on file   Social Determinants of Health   Financial Resource Strain:   . Difficulty of Paying Living Expenses: Not on file  Food Insecurity:   . Worried About Charity fundraiser in the Last Year: Not on file  . Ran Out of Food in the Last Year: Not on file  Transportation Needs:   . Lack of Transportation (Medical): Not on file  . Lack of Transportation (Non-Medical): Not on file  Physical Activity:   . Days of Exercise per  Week: Not on file  . Minutes of Exercise per Session: Not on file  Stress:   . Feeling of Stress : Not on file  Social Connections:   . Frequency of Communication with Friends and Family: Not on file  . Frequency of Social Gatherings with Friends and Family: Not on file  . Attends Religious Services: Not on file  . Active Member of Clubs or Organizations: Not on file  . Attends Archivist Meetings: Not on file  . Marital Status: Not on file  Intimate Partner Violence:   . Fear of Current or Ex-Partner: Not on file  . Emotionally Abused: Not on file  . Physically Abused: Not on file  . Sexually Abused: Not on file    Review of Systems: See HPI, otherwise negative ROS  Physical Exam: BP (!) 187/117   Pulse 78   Temp (!) 97.1 F (36.2 C) (Skin)   Resp 16   Ht _0  (1.575 m)   Wt 43.1 kg   SpO2 100%   BMI 17.38 kg/m  General:   Alert,  pleasant and cooperative in NAD Head:  Normocephalic and atraumatic. Neck:  Supple; no masses or thyromegaly. Lungs:  Clear throughout to auscultation, normal respiratory effort.    Heart:  +S1, +S2, Regular rate and rhythm, No edema. Abdomen:  Soft, nontender and nondistended. Normal bowel sounds, without guarding, and without rebound.   Neurologic:  Alert and  oriented x4;  grossly normal neurologically.  Impression/Plan: Leonard Schwartz is here for an colonoscopy to be performed for surveillance due to prior history of colon polyps   Risks, benefits, limitations, and alternatives regarding  colonoscopy have been reviewed with the patient.  Questions have been answered.  All parties agreeable.   Jonathon Bellows, MD  04/25/2020, 7:59 AM

## 2020-04-25 NOTE — Progress Notes (Signed)
Appointment scheduled for patient to see her Primary Care Physician tomorrow, April 26, 2020, at 11:30am for follow-up of her hypertension. The patient is aware of the appointment and states she will go and have the issue addressed.  The patient's sister informed of the procedure cancellation due to hypertension as well as her follow-up appointment tomorrow.  The patient is to notify Dr. Johnney Killian office regarding rescheduling her colonoscopy once her hypertension is under control.

## 2020-04-25 NOTE — Anesthesia Preprocedure Evaluation (Signed)
Anesthesia Evaluation  Patient identified by MRN, date of birth, ID band Patient awake    Reviewed: Allergy & Precautions, H&P , NPO status , Patient's Chart, lab work & pertinent test results, reviewed documented beta blocker date and time   Airway Mallampati: II   Neck ROM: full    Dental  (+) Poor Dentition   Pulmonary neg pulmonary ROS, Current Smoker,    Pulmonary exam normal        Cardiovascular hypertension, On Medications negative cardio ROS Normal cardiovascular exam Rhythm:regular Rate:Normal     Neuro/Psych Anxiety Depression negative neurological ROS  negative psych ROS   GI/Hepatic negative GI ROS, Neg liver ROS,   Endo/Other  negative endocrine ROS  Renal/GU negative Renal ROS  negative genitourinary   Musculoskeletal   Abdominal   Peds  Hematology negative hematology ROS (+)   Anesthesia Other Findings Past Medical History: No date: Anxiety No date: Depression No date: Hypertension Past Surgical History: 06/24/2019: CATARACT EXTRACTION W/PHACO; Left     Comment:  Procedure: CATARACT EXTRACTION PHACO AND INTRAOCULAR               LENS PLACEMENT (IOC) LEFT MALYUGIN  0.59  00:11.9  4.9%;               Surgeon: Lockie Mola, MD;  Location: [facility];  Service: Ophthalmology;  Laterality: Left; 07/15/2019: CATARACT EXTRACTION W/PHACO; Right     Comment:  Procedure: CATARACT EXTRACTION PHACO AND INTRAOCULAR               LENS PLACEMENT (IOC) RIGHT HEALON 5 VISION BLUE MALYUGIN               0.59,   00:09.4, 15.6%;  Surgeon: Lockie Mola,               MD;  [facility]: [facility];  Service:               Ophthalmology;  Laterality: Right; 01/21/2020: COLONOSCOPY WITH PROPOFOL; N/A     Comment:  Procedure: COLONOSCOPY WITH PROPOFOL;  Surgeon: [name], MD;  Location: [facility] ENDOSCOPY;  Service:               Gastroenterology;  Laterality:  N/A; No date: EYE SURGERY 2015: PANCREAS SURGERY     Comment:  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 pancreas removed BMI    Body Mass Index: 17.38 kg/m     Reproductive/Obstetrics negative OB ROS                             Anesthesia Physical Anesthesia Plan  ASA: II  Anesthesia Plan: General   Post-op Pain Management:    Induction:   PONV Risk Score and Plan:   Airway Management Planned:   Additional Equipment:   Intra-op Plan:   Post-operative Plan:   Informed Consent: I have reviewed the patients History and Physical, chart, labs and discussed the procedure including the risks, benefits and alternatives for the proposed anesthesia with the patient or authorized representative who has indicated his/her understanding and acceptance.     Dental Advisory Given  Plan Discussed with: CRNA  Anesthesia Plan Comments:         Anesthesia Quick Evaluation

## 2020-04-25 NOTE — OR Nursing (Signed)
Pt BP elevated 216/113 on entering the procedure room.  Treated my CRNA and stalled starting procedure until under control.

## 2020-04-25 NOTE — Transfer of Care (Signed)
Immediate Anesthesia Transfer of Care Note  Patient: Megan Mayer  Procedure(s) Performed: COLONOSCOPY WITH PROPOFOL (N/A )  Patient Location: PACU   Level of Consciousness: awake, alert  and oriented  Airway & Oxygen Therapy: Patient Spontanous Breathing  Post-op Assessment: Report given to RN and Post -op Vital signs reviewed and stable  Post vital signs: Reviewed and stable  Last Vitals:  Vitals Value Taken Time  BP 199/111 04/25/20 0859  Temp    Pulse 79 04/25/20 0859  Resp 19 04/25/20 0859  SpO2 98 % 04/25/20 0859  Vitals shown include unvalidated device data.  Last Pain:  Vitals:   04/25/20 0859  TempSrc: (P) Temporal  PainSc:          Complications: No complications documented.

## 2020-04-26 ENCOUNTER — Encounter: Payer: Self-pay | Admitting: Gastroenterology

## 2020-10-14 ENCOUNTER — Other Ambulatory Visit: Payer: Self-pay | Admitting: Family Medicine

## 2020-10-14 DIAGNOSIS — Z1231 Encounter for screening mammogram for malignant neoplasm of breast: Secondary | ICD-10-CM

## 2020-10-27 ENCOUNTER — Other Ambulatory Visit: Payer: Self-pay

## 2020-10-27 ENCOUNTER — Ambulatory Visit
Admission: RE | Admit: 2020-10-27 | Discharge: 2020-10-27 | Disposition: A | Discharge status: Home / Self Care | Payer: Medicare Other | Source: Ambulatory Visit | Attending: Family Medicine | Admitting: Family Medicine

## 2020-10-27 DIAGNOSIS — Z1231 Encounter for screening mammogram for malignant neoplasm of breast: Secondary | ICD-10-CM | POA: Diagnosis present

## 2021-02-08 ENCOUNTER — Other Ambulatory Visit: Payer: Self-pay | Admitting: Family Medicine

## 2021-02-08 DIAGNOSIS — Z13820 Encounter for screening for osteoporosis: Secondary | ICD-10-CM

## 2022-04-11 ENCOUNTER — Other Ambulatory Visit: Payer: Self-pay | Admitting: Internal Medicine

## 2022-04-11 DIAGNOSIS — Z1231 Encounter for screening mammogram for malignant neoplasm of breast: Secondary | ICD-10-CM

## 2022-04-20 ENCOUNTER — Ambulatory Visit
Admission: RE | Admit: 2022-04-20 | Discharge: 2022-04-20 | Disposition: A | Discharge status: Home / Self Care | Payer: Medicare Other | Source: Ambulatory Visit | Attending: Internal Medicine | Admitting: Internal Medicine

## 2022-04-20 DIAGNOSIS — Z1231 Encounter for screening mammogram for malignant neoplasm of breast: Secondary | ICD-10-CM

## 2022-04-24 ENCOUNTER — Other Ambulatory Visit: Payer: Self-pay | Admitting: Internal Medicine

## 2022-04-24 DIAGNOSIS — R928 Other abnormal and inconclusive findings on diagnostic imaging of breast: Secondary | ICD-10-CM

## 2022-05-09 ENCOUNTER — Other Ambulatory Visit: Payer: Self-pay | Admitting: Internal Medicine

## 2022-05-09 ENCOUNTER — Ambulatory Visit
Admission: RE | Admit: 2022-05-09 | Discharge: 2022-05-09 | Disposition: A | Discharge status: Home / Self Care | Payer: Medicare Other | Source: Ambulatory Visit | Attending: Internal Medicine | Admitting: Internal Medicine

## 2022-05-09 DIAGNOSIS — R921 Mammographic calcification found on diagnostic imaging of breast: Secondary | ICD-10-CM

## 2022-05-09 DIAGNOSIS — R928 Other abnormal and inconclusive findings on diagnostic imaging of breast: Secondary | ICD-10-CM

## 2022-06-06 ENCOUNTER — Ambulatory Visit
Admission: RE | Admit: 2022-06-06 | Discharge: 2022-06-06 | Disposition: A | Discharge status: Home / Self Care | Payer: Medicare Other | Source: Ambulatory Visit | Attending: Internal Medicine | Admitting: Internal Medicine

## 2022-06-06 DIAGNOSIS — R921 Mammographic calcification found on diagnostic imaging of breast: Secondary | ICD-10-CM

## 2022-06-06 HISTORY — PX: BREAST BIOPSY: SHX20

## 2022-09-11 ENCOUNTER — Ambulatory Visit: Payer: 59 | Admitting: Podiatry

## 2022-10-09 ENCOUNTER — Ambulatory Visit: Payer: 59 | Admitting: Podiatry

## 2022-10-16 ENCOUNTER — Ambulatory Visit: Payer: 59 | Admitting: Podiatry

## 2022-11-02 ENCOUNTER — Ambulatory Visit: Payer: 59 | Admitting: Podiatry

## 2022-11-08 ENCOUNTER — Other Ambulatory Visit: Payer: Self-pay | Admitting: Internal Medicine

## 2022-11-08 ENCOUNTER — Ambulatory Visit
Admission: RE | Admit: 2022-11-08 | Discharge: 2022-11-08 | Disposition: A | Discharge status: Home / Self Care | Payer: 59 | Source: Ambulatory Visit | Attending: Internal Medicine | Admitting: Internal Medicine

## 2022-11-08 DIAGNOSIS — R921 Mammographic calcification found on diagnostic imaging of breast: Secondary | ICD-10-CM

## 2024-05-05 LAB — COLOGUARD: COLOGUARD: NEGATIVE

## 2024-06-25 ENCOUNTER — Other Ambulatory Visit: Payer: Self-pay | Admitting: Internal Medicine

## 2024-06-25 DIAGNOSIS — R634 Abnormal weight loss: Secondary | ICD-10-CM

## 2024-07-03 ENCOUNTER — Ambulatory Visit

## 2024-07-10 ENCOUNTER — Ambulatory Visit: Admission: RE | Admit: 2024-07-10 | Source: Ambulatory Visit

## 2024-07-17 ENCOUNTER — Ambulatory Visit
Admission: RE | Admit: 2024-07-17 | Discharge: 2024-07-17 | Disposition: A | Source: Ambulatory Visit | Attending: Internal Medicine

## 2024-07-17 DIAGNOSIS — I2510 Atherosclerotic heart disease of native coronary artery without angina pectoris: Secondary | ICD-10-CM | POA: Insufficient documentation

## 2024-07-17 DIAGNOSIS — R634 Abnormal weight loss: Secondary | ICD-10-CM | POA: Insufficient documentation

## 2024-07-17 DIAGNOSIS — I700 Atherosclerosis of aorta: Secondary | ICD-10-CM | POA: Diagnosis not present

## 2024-07-17 DIAGNOSIS — K6389 Other specified diseases of intestine: Secondary | ICD-10-CM | POA: Diagnosis not present

## 2024-07-17 DIAGNOSIS — I71011 Dissection of aortic arch: Secondary | ICD-10-CM | POA: Insufficient documentation

## 2024-07-17 DIAGNOSIS — N200 Calculus of kidney: Secondary | ICD-10-CM | POA: Diagnosis not present

## 2024-07-17 MED ORDER — IOHEXOL 300 MG/ML  SOLN
80.0000 mL | Freq: Once | INTRAMUSCULAR | Status: AC | PRN
  Administered 2024-07-17: 80 mL via INTRAVENOUS
